# Patient Record
Sex: Female | Born: 2000 | Hispanic: No | Smoking: Never smoker
Health system: Southern US, Community
[De-identification: ages and names within clinical notes are randomized; demographics above are authoritative.]

## PROBLEM LIST (undated history)

## (undated) DIAGNOSIS — F909 Attention-deficit hyperactivity disorder, unspecified type: Secondary | ICD-10-CM

## (undated) HISTORY — PX: NO PAST SURGERIES: SHX2092

---
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
## 2019-01-14 ENCOUNTER — Other Ambulatory Visit: Payer: Self-pay

## 2019-01-14 ENCOUNTER — Encounter: Payer: Self-pay | Admitting: Physician Assistant

## 2019-01-14 ENCOUNTER — Ambulatory Visit (INDEPENDENT_AMBULATORY_CARE_PROVIDER_SITE_OTHER): Payer: BLUE CROSS/BLUE SHIELD | Admitting: Physician Assistant

## 2019-01-14 VITALS — Ht 66.0 in | Wt 279.0 lb

## 2019-01-14 DIAGNOSIS — N938 Other specified abnormal uterine and vaginal bleeding: Secondary | ICD-10-CM

## 2019-01-14 DIAGNOSIS — E559 Vitamin D deficiency, unspecified: Secondary | ICD-10-CM | POA: Diagnosis not present

## 2019-01-14 DIAGNOSIS — D508 Other iron deficiency anemias: Secondary | ICD-10-CM | POA: Diagnosis not present

## 2019-01-14 DIAGNOSIS — Z3044 Encounter for surveillance of vaginal ring hormonal contraceptive device: Secondary | ICD-10-CM

## 2019-01-14 DIAGNOSIS — R5383 Other fatigue: Secondary | ICD-10-CM | POA: Insufficient documentation

## 2019-01-14 DIAGNOSIS — E039 Hypothyroidism, unspecified: Secondary | ICD-10-CM

## 2019-01-14 MED ORDER — ETONOGESTREL-ETHINYL ESTRADIOL 0.12-0.015 MG/24HR VA RING: VAGINAL_RING | VAGINAL | 4 refills | Status: DC

## 2019-01-14 NOTE — Progress Notes (Signed)
Needs nuva ring in and doesn't have a new rx for it.

## 2019-01-14 NOTE — Progress Notes (Signed)
Subjective:    Patient ID: Carrie Spears, female    DOB: 04/13/2001, 18 y.o.   MRN: [id number]  HPI  Patient is an 18 year old female who presents to the clinic via WebEx for medication refills.  Patient would like a refill of her NuvaRing for birth control.  She does report to be sexually active.  She is tolerating the birth control well.  She has noticed some decrease in bleeding with her cycle and that time she has not had a cycle.  She did use a pregnancy test which was negative.  She admits she is not taking many of her medications such as thyroid, B12, vitamin D, iron.  She feels okay.  .. Active Ambulatory Problems    Diagnosis Date Noted  . 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 Pre-diabetes 12/28/2014  . Mood swings 12/28/2014  . Morbid obesity (HCC) 02/02/2016  . ADD (attention deficit disorder) 02/02/2016  . Other fatigue 02/02/2016  . Thyromegaly 02/02/2016  . Dysuria 02/02/2016  . Vitamin D deficiency 02/08/2016  . Elevated WBC count 02/08/2016  . Hypothyroidism 02/08/2016  . Reactive airway disease 12/15/2016  . Neck tightness 12/09/2017  . Neck pain 12/09/2017  . DUB (dysfunctional uterine bleeding) 07/08/2018  . Palpitations 07/08/2018  . Iron deficiency anemia 07/08/2018  . Low energy 01/14/2019   Resolved Ambulatory Problems    Diagnosis Date Noted  . No Resolved Ambulatory Problems   Past Medical History:  Diagnosis Date  . ADHD (attention deficit hyperactivity disorder)       Review of Systems See HPI.     Objective:   Physical Exam Vitals signs reviewed.  Constitutional:      Appearance: Normal appearance.  Cardiovascular:     Rate and Rhythm: Normal rate.     Pulses: Normal pulses.  Pulmonary:     Effort: Pulmonary effort is normal.  Neurological:     General: No focal deficit present.     Mental Status: She is alert.  Psychiatric:        Mood and Affect: Mood normal.            Assessment & Plan:  Marland KitchenMarland KitchenDiagnoses and all orders for this visit:  Encounter for surveillance of vaginal ring hormonal contraceptive device -     etonogestrel-ethinyl estradiol (NUVARING) 0.12-0.015 MG/24HR vaginal ring; Insert vaginally and leave in place for 3 consecutive weeks, then remove for 1 week.  DUB (dysfunctional uterine bleeding) -     etonogestrel-ethinyl estradiol (NUVARING) 0.12-0.015 MG/24HR vaginal ring; Insert vaginally and leave in place for 3 consecutive weeks, then remove for 1 week.  Vitamin D deficiency -     VITAMIN D 25 Hydroxy (Vit-D Deficiency, Fractures)  Iron deficiency anemia secondary to inadequate dietary iron intake -     CBC w/Diff/Platelet -     Fe+TIBC+Fer  Acquired hypothyroidism -     TSH  Low energy -     VITAMIN D 25 Hydroxy (Vit-D Deficiency, Fractures) -     B12 and Folate Panel -     CBC w/Diff/Platelet -     TSH -     Fe+TIBC+Fer   Will refill NuvaRing today.  She is 17 and does not need a Pap smear.  Did encourage her to get regular STD screening.  Did reassure her that sometimes periods will decrease with NuvaRing.  Certainly patient could have some thyroid, anemia, vitamin D abnormality since she has stopped all medication.  Going back on medication could help  to regulate this.  We will check with labs first.  Explained to patient I really need her blood pressure reading.  I would like for her to call back with this reading and we can make an addendum.

## 2019-03-11 IMAGING — CR DG CHEST 2V
2 series · 2 of 2 positions shown · non-contrast
Comparison: None.

CLINICAL DATA: Chest pain and palpitations

EXAM:
CHEST - 2 VIEW

[w chest pa]
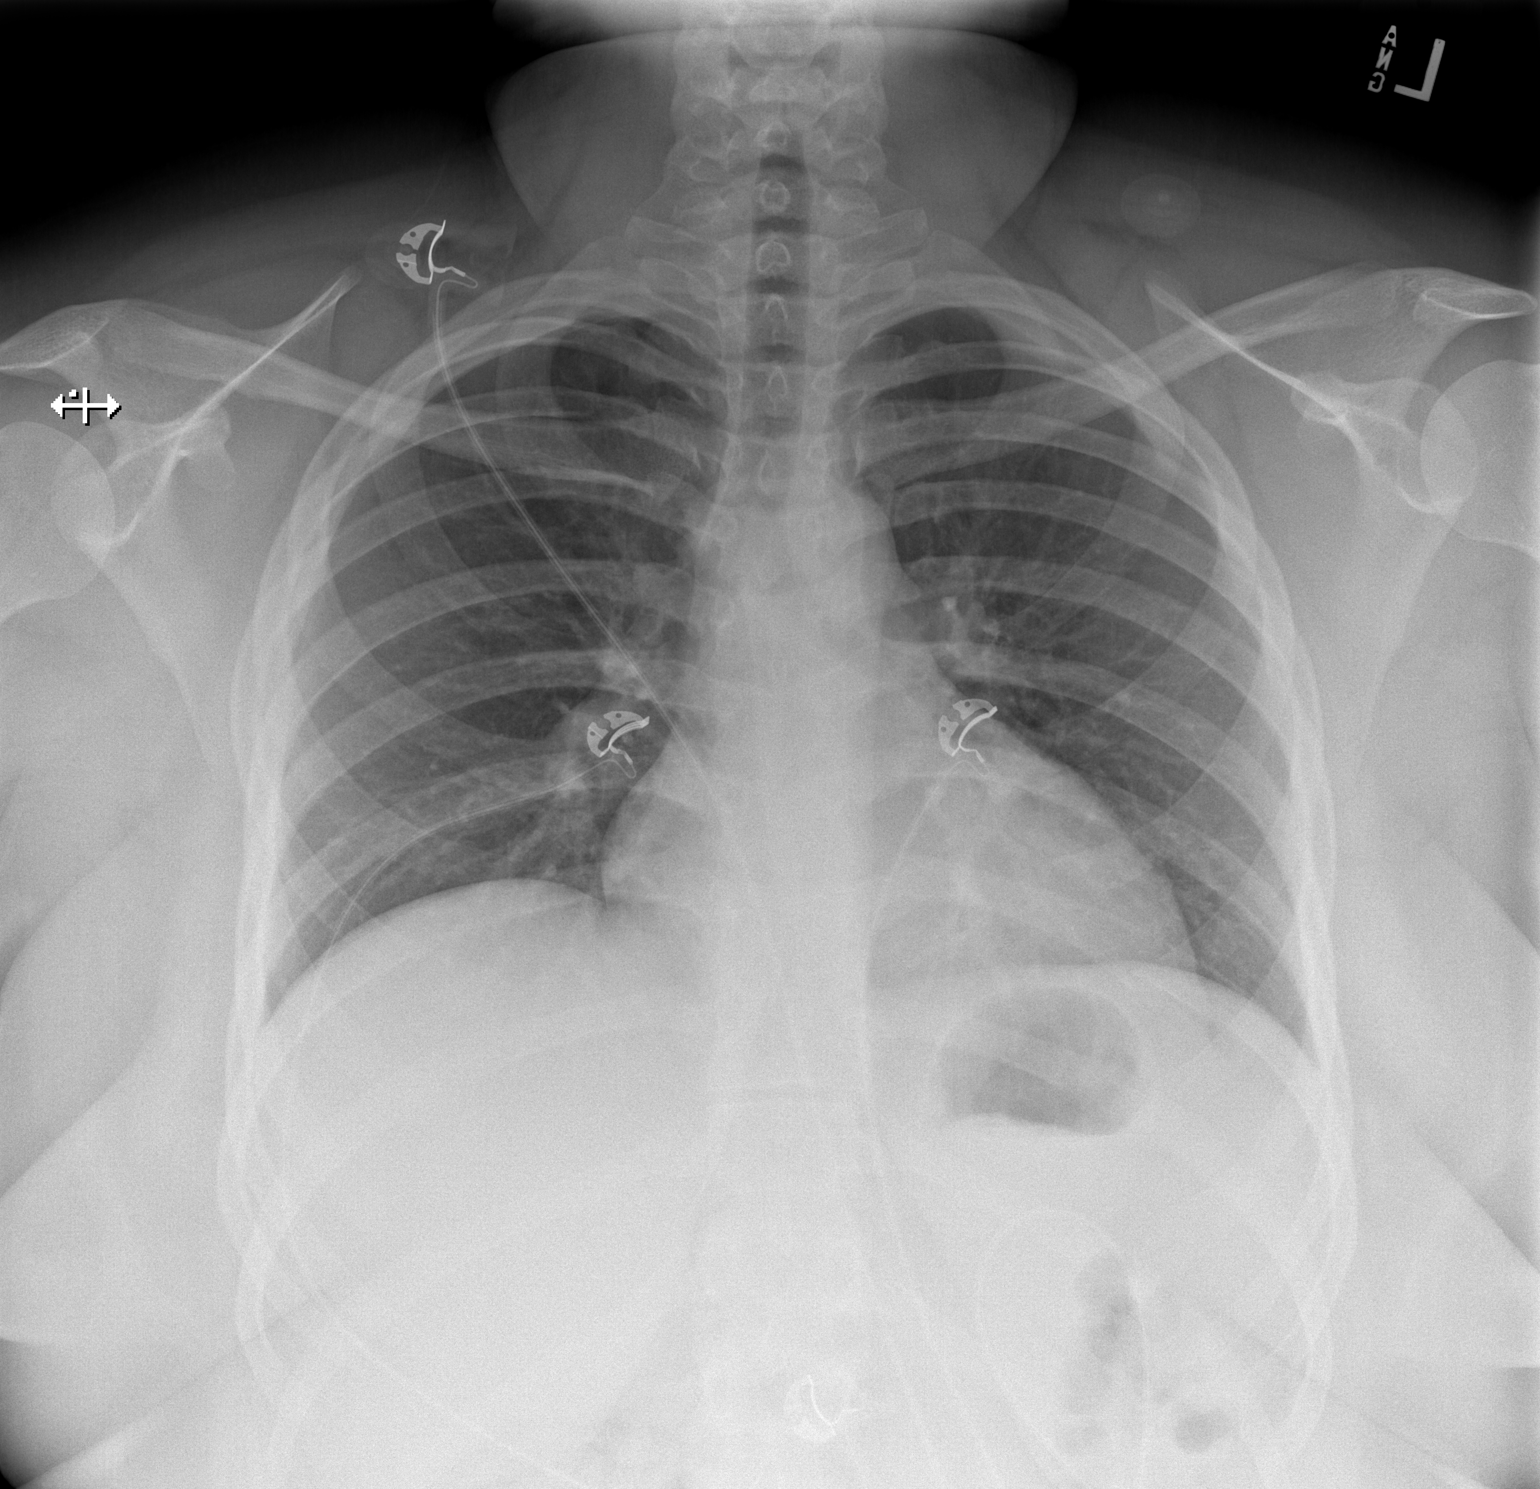

[w chest lat]
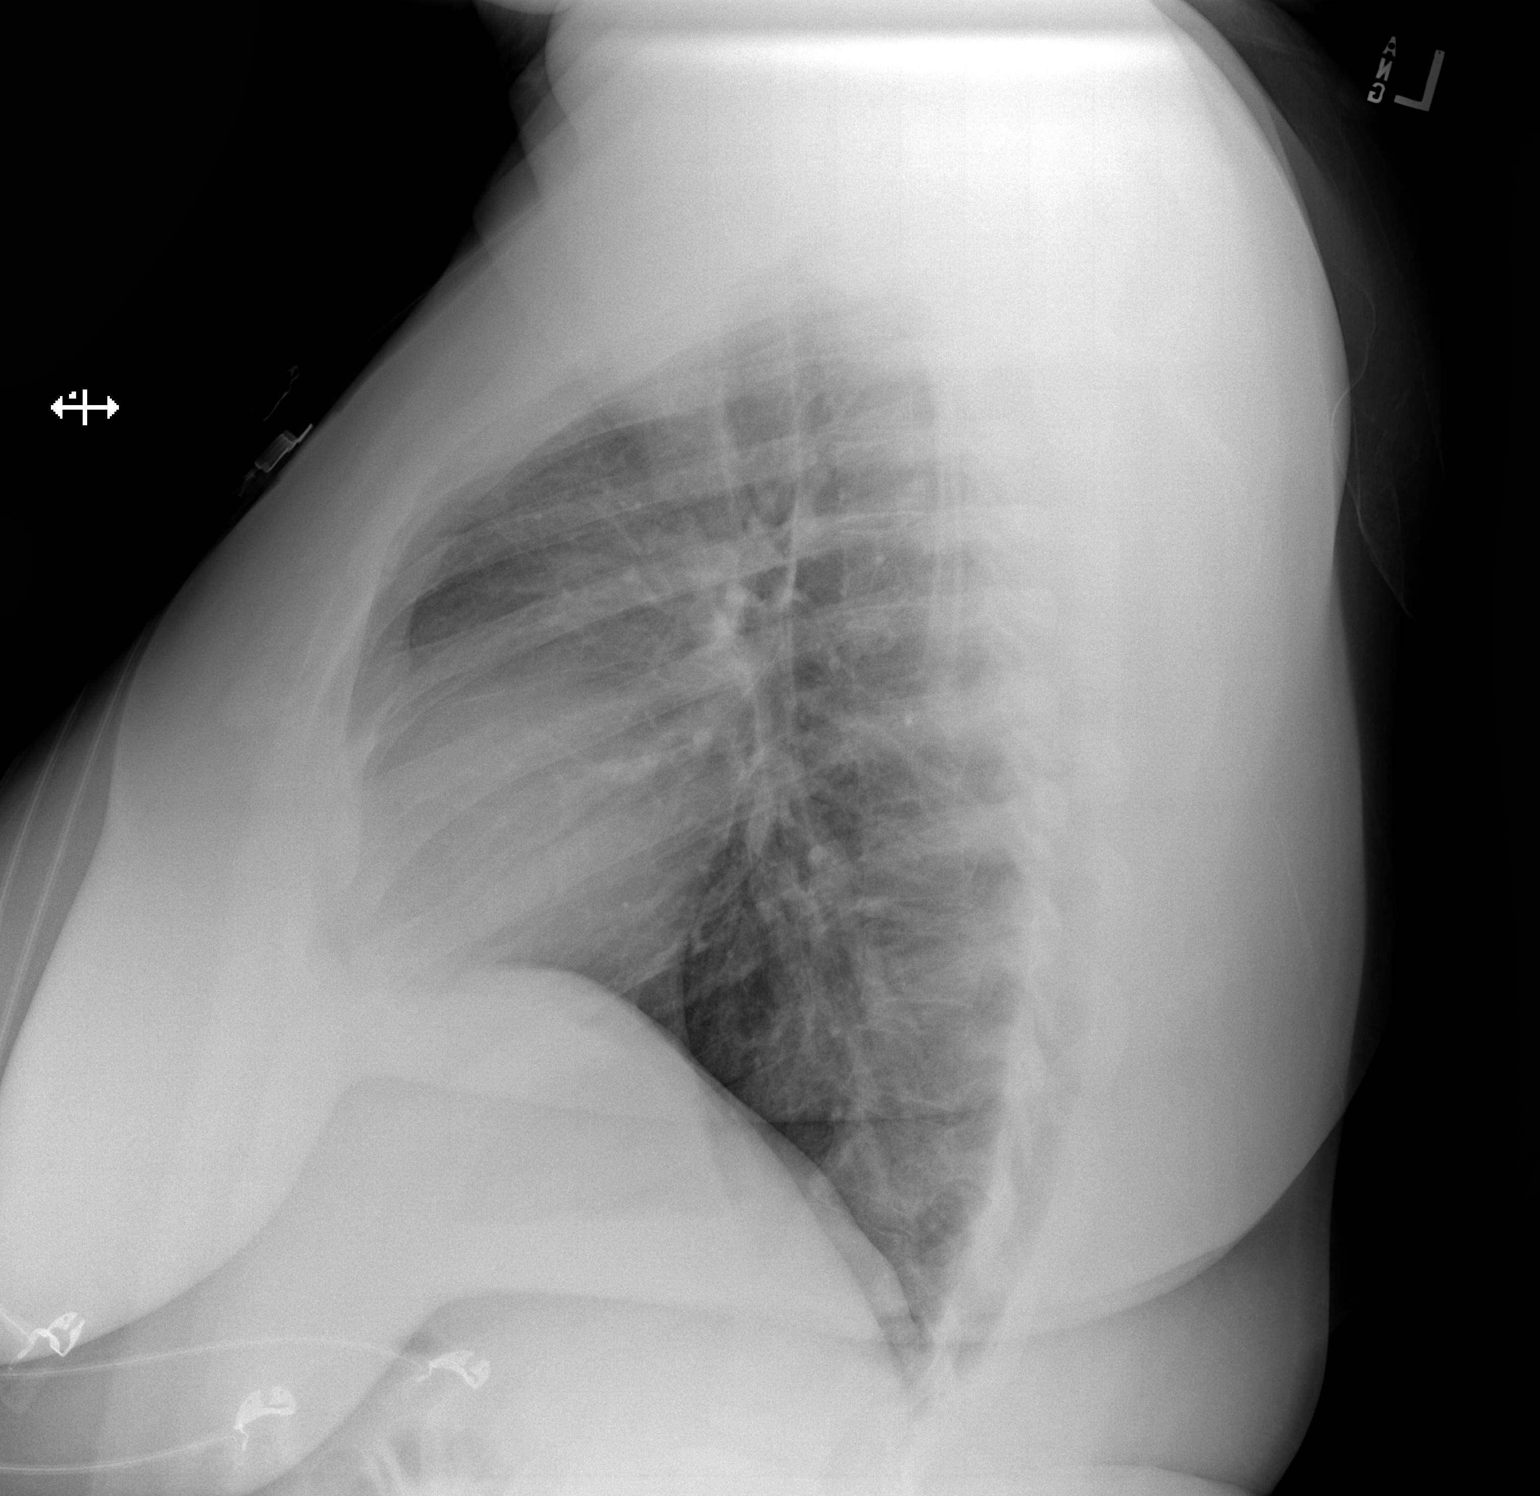

[2 of 2 positions shown; findings below may reference images not displayed]

FINDINGS: The heart size and mediastinal contours are within normal limits.
Both lungs are clear. The visualized skeletal structures are
unremarkable.
IMPRESSION: No active cardiopulmonary disease.

## 2019-03-21 ENCOUNTER — Ambulatory Visit: Payer: BC Managed Care – PPO | Admitting: Physician Assistant

## 2019-07-31 ENCOUNTER — Ambulatory Visit: Payer: BC Managed Care – PPO | Admitting: Family Medicine

## 2019-07-31 ENCOUNTER — Other Ambulatory Visit: Payer: Self-pay

## 2019-07-31 ENCOUNTER — Encounter: Payer: Self-pay | Admitting: Family Medicine

## 2019-07-31 ENCOUNTER — Ambulatory Visit (INDEPENDENT_AMBULATORY_CARE_PROVIDER_SITE_OTHER): Payer: BC Managed Care – PPO | Admitting: Family Medicine

## 2019-07-31 VITALS — BP 125/80 | HR 114 | Ht 66.0 in | Wt 286.0 lb

## 2019-07-31 DIAGNOSIS — R300 Dysuria: Secondary | ICD-10-CM | POA: Diagnosis not present

## 2019-07-31 DIAGNOSIS — N3000 Acute cystitis without hematuria: Secondary | ICD-10-CM | POA: Diagnosis not present

## 2019-07-31 DIAGNOSIS — N898 Other specified noninflammatory disorders of vagina: Secondary | ICD-10-CM | POA: Diagnosis not present

## 2019-07-31 DIAGNOSIS — R809 Proteinuria, unspecified: Secondary | ICD-10-CM

## 2019-07-31 DIAGNOSIS — R81 Glycosuria: Secondary | ICD-10-CM

## 2019-07-31 DIAGNOSIS — A5901 Trichomonal vulvovaginitis: Secondary | ICD-10-CM

## 2019-07-31 DIAGNOSIS — Z23 Encounter for immunization: Secondary | ICD-10-CM

## 2019-07-31 LAB — POCT URINALYSIS DIPSTICK
Glucose, UA: POSITIVE — AB
Ketones, UA: 15
Nitrite, UA: POSITIVE
Protein, UA: POSITIVE — AB
Spec Grav, UA: 1.025 (ref 1.010–1.025)
Urobilinogen, UA: 4 E.U./dL — AB
pH, UA: 5 (ref 5.0–8.0)

## 2019-07-31 MED ORDER — METRONIDAZOLE 500 MG PO TABS: 2000.0000 mg | ORAL_TABLET | Freq: Once | ORAL | 0 refills | Status: AC

## 2019-07-31 MED ORDER — SULFAMETHOXAZOLE-TRIMETHOPRIM 800-160 MG PO TABS: 1.0000 | ORAL_TABLET | Freq: Two times a day (BID) | ORAL | 0 refills | Status: DC

## 2019-07-31 NOTE — Addendum Note (Signed)
Addended by: Beatrice Lecher D on: 07/31/2019 07:45 PM   Modules accepted: Orders

## 2019-07-31 NOTE — Progress Notes (Addendum)
Acute Office Visit  Subjective:    Patient ID: Carrie Spears, female    DOB: 06/16/2001, 18 y.o.   MRN: [id number]  Chief Complaint  Patient presents with  . Dysuria    she reports that her sxs got worse on Tuesday. she has been taking AZO   . Vaginal Pain    HPI   Patient is in today for dysuria that started on Monday.  It was more noticeable by Tuesday.  She says she just has irritation externally when she urinates she has not noticed any rash or abnormal vaginal discharge.  She has not had any concerning pelvic pain.  She is not currently sexually active.  No fevers chills or sweats.  No back pain.  She has had some urinary frequency but feels like she is emptying her bladder normally.  Past Medical History:  Diagnosis Date  . ADHD (attention deficit hyperactivity disorder)     No past surgical history on file.  Family History  Problem Relation Age of Onset  . Hypertension Mother   . Lupus Maternal Aunt   . Diabetes Maternal Aunt     Social History   Socioeconomic History  . Marital status: Single    Spouse name: Not on file  . Number of children: Not on file  . Years of education: Not on file  . Highest education level: Not on file  Occupational History  . Not on file  Social Needs  . Financial resource strain: Not on file  . Food insecurity    Worry: Not on file    Inability: Not on file  . Transportation needs    Medical: Not on file    Non-medical: Not on file  Tobacco Use  . Smoking status: Never Smoker  . Smokeless tobacco: Never Used  Substance and Sexual Activity  . Alcohol use: No    Alcohol/week: 0.0 standard drinks  . Drug use: No  . Sexual activity: Never  Lifestyle  . Physical activity    Days per week: Not on file    Minutes per session: Not on file  . Stress: Not on file  Relationships  . Social Musicianconnections    Talks on phone: Not on file    Gets together: Not on file    Attends religious service: Not on file    Active member of  club or organization: Not on file    Attends meetings of clubs or organizations: Not on file    Relationship status: Not on file  . Intimate partner violence    Fear of current or ex partner: Not on file    Emotionally abused: Not on file    Physically abused: Not on file    Forced sexual activity: Not on file  Other Topics Concern  . Not on file  Social History Narrative  . Not on file    Outpatient Medications Prior to Visit  Medication Sig Dispense Refill  . albuterol (PROVENTIL HFA;VENTOLIN HFA) 108 (90 Base) MCG/ACT inhaler Inhale 2 puffs into the lungs every 6 (six) hours as needed for wheezing or shortness of breath. 1 Inhaler 5  . etonogestrel-ethinyl estradiol (NUVARING) 0.12-0.015 MG/24HR vaginal ring Insert vaginally and leave in place for 3 consecutive weeks, then remove for 1 week. 3 each 4  . levothyroxine (SYNTHROID, LEVOTHROID) 75 MCG tablet Take 1 tablet (75 mcg total) by mouth daily. (Patient not taking: Reported on 07/06/2018) 30 tablet 1  . Vitamin D, Ergocalciferol, (DRISDOL) 50000 units CAPS capsule Take  1 capsule (50,000 Units total) by mouth every 7 (seven) days. (Patient not taking: Reported on 01/14/2019) 12 capsule 0   No facility-administered medications prior to visit.     No Known Allergies  ROS     Objective:    Physical Exam  Constitutional: She is oriented to person, place, and time. She appears well-developed and well-nourished.  HENT:  Head: Normocephalic and atraumatic.  Eyes: Conjunctivae and EOM are normal.  Cardiovascular: Normal rate.  Pulmonary/Chest: Effort normal.  Musculoskeletal:     Comments: No CVA tenderness, no suprapubic tenderness.    Neurological: She is alert and oriented to person, place, and time.  Skin: Skin is dry. No pallor.  Psychiatric: She has a normal mood and affect. Her behavior is normal.  Vitals reviewed.   BP 125/80   Pulse (!) 114   Ht 5\' 6"  (1.676 m)   Wt 286 lb (129.7 kg)   LMP 07/28/2019 (Exact  Date)   SpO2 99%   BMI 46.16 kg/m  Wt Readings from Last 3 Encounters:  07/31/19 286 lb (129.7 kg) (>99 %, Z= 2.73)*  01/14/19 279 lb (126.6 kg) (>99 %, Z= 2.65)*  11/09/18 240 lb (108.9 kg) (>99 %, Z= 2.38)*   * Growth percentiles are based on CDC (Girls, 2-20 Years) data.    Health Maintenance Due  Topic Date Due  . HIV Screening  04/19/2015    There are no preventive care reminders to display for this patient.   Lab Results  Component Value Date   TSH 1.18 07/08/2018   Lab Results  Component Value Date   WBC 11.9 (H) 07/06/2018   HGB 10.7 (L) 07/06/2018   HCT 34.5 (L) 07/06/2018   MCV 86.0 07/06/2018   PLT 533 (H) 07/06/2018   Lab Results  Component Value Date   NA 139 07/06/2018   K 4.1 07/06/2018   CO2 24 07/06/2018   GLUCOSE 85 07/06/2018   BUN 7 07/06/2018   CREATININE 0.60 07/06/2018   BILITOT 0.3 02/07/2016   ALKPHOS 84 02/07/2016   AST 14 02/07/2016   ALT 19 02/07/2016   PROT 7.1 02/07/2016   ALBUMIN 4.2 02/07/2016   CALCIUM 9.3 07/06/2018   ANIONGAP 9 07/06/2018   No results found for: CHOL No results found for: HDL No results found for: LDLCALC No results found for: TRIG No results found for: CHOLHDL No results found for: HGBA1C     Assessment & Plan:   Problem List Items Addressed This Visit      Other   Dysuria   Relevant Orders   POCT urinalysis dipstick (Completed)   WET PREP FOR [place] (Completed)    Other Visit Diagnoses    Acute cystitis without hematuria    -  Primary   Vaginal itching       Relevant Orders   POCT urinalysis dipstick (Completed)   WET PREP FOR [place], [place] (Completed)   Need for immunization against influenza       Relevant Orders   Flu Vaccine QUAD 36+ mos IM (Completed)   Proteinuria, unspecified type       Relevant Orders   Urinalysis, Routine w reflex microscopic   Glucosuria       Relevant Orders   Urinalysis, Routine w reflex microscopic   Trichomonas vaginitis        Relevant Medications   sulfamethoxazole-trimethoprim (BACTRIM DS) 800-160 MG tablet   metroNIDAZOLE (FLAGYL) 500 MG tablet     Acute UTI-urinalysis and symptoms  consistent with UTI.  We will go ahead and treat with Bactrim.  Call if not better in 1 week.  Proteinuria-could just be secondary to UTI but would like her to repeat UA in about 1-1/2 weeks to make sure that it resolves.  Glucosuria-again likely because of UTI and taking Azo which can cause some false positives on the dipstick.  Again we will repeat urinalysis in 1 week if still positive then recommend doing a hemoglobin A1c for further work-up.  Offered to do A1c today but patient declined.  Vaginal itching-wet prep sent today for vaginal itching.  Wet prep came back positive for trichomoniasis.  Will treat with 2 g of metronidazole orally  Meds ordered this encounter  Medications  . sulfamethoxazole-trimethoprim (BACTRIM DS) 800-160 MG tablet    Sig: Take 1 tablet by mouth 2 (two) times daily.    Dispense:  6 tablet    Refill:  0  . metroNIDAZOLE (FLAGYL) 500 MG tablet    Sig: Take 4 tablets (2,000 mg total) by mouth once for 1 dose.    Dispense:  4 tablet    Refill:  0     Nani Gasser, MD

## 2019-08-01 LAB — WET PREP FOR TRICH, YEAST, CLUE
MICRO NUMBER:: 993742
Specimen Quality: ADEQUATE

## 2019-08-06 ENCOUNTER — Ambulatory Visit (INDEPENDENT_AMBULATORY_CARE_PROVIDER_SITE_OTHER): Payer: BC Managed Care – PPO | Admitting: Physician Assistant

## 2019-08-06 ENCOUNTER — Other Ambulatory Visit: Payer: Self-pay

## 2019-08-06 VITALS — BP 121/59 | HR 97 | Temp 98.5°F | Ht 66.0 in | Wt 281.0 lb

## 2019-08-06 DIAGNOSIS — R7303 Prediabetes: Secondary | ICD-10-CM | POA: Diagnosis not present

## 2019-08-06 DIAGNOSIS — R81 Glycosuria: Secondary | ICD-10-CM | POA: Diagnosis not present

## 2019-08-06 DIAGNOSIS — N72 Inflammatory disease of cervix uteri: Secondary | ICD-10-CM | POA: Diagnosis not present

## 2019-08-06 DIAGNOSIS — R300 Dysuria: Secondary | ICD-10-CM

## 2019-08-06 DIAGNOSIS — N3001 Acute cystitis with hematuria: Secondary | ICD-10-CM

## 2019-08-06 DIAGNOSIS — R102 Pelvic and perineal pain: Secondary | ICD-10-CM

## 2019-08-06 LAB — POCT URINALYSIS DIPSTICK
Bilirubin, UA: NEGATIVE
Glucose, UA: POSITIVE — AB
Nitrite, UA: POSITIVE
Protein, UA: POSITIVE — AB
Spec Grav, UA: 1.02 (ref 1.010–1.025)
Urobilinogen, UA: 1 E.U./dL
pH, UA: 6 (ref 5.0–8.0)

## 2019-08-06 LAB — POCT GLYCOSYLATED HEMOGLOBIN (HGB A1C): Hemoglobin A1C: 5.7 % — AB (ref 4.0–5.6)

## 2019-08-06 MED ORDER — FLUCONAZOLE 150 MG PO TABS: 150.0000 mg | ORAL_TABLET | Freq: Once | ORAL | 0 refills | Status: AC

## 2019-08-06 MED ORDER — CEFTRIAXONE SODIUM 1 G IJ SOLR
1.0000 g | Freq: Once | INTRAMUSCULAR | Status: AC
  Administered 2019-08-06: 1 g via INTRAMUSCULAR

## 2019-08-06 MED ORDER — NITROFURANTOIN MONOHYD MACRO 100 MG PO CAPS: 100.0000 mg | ORAL_CAPSULE | Freq: Two times a day (BID) | ORAL | 0 refills | Status: DC

## 2019-08-06 MED ORDER — AZITHROMYCIN 1 G PO PACK
1.0000 g | PACK | Freq: Once | ORAL | Status: AC
  Administered 2019-08-06: 1 g via ORAL

## 2019-08-06 NOTE — Patient Instructions (Signed)
macrobid to start today.  Cervicitis  Cervicitis is when the cervix gets irritated and swollen. Your cervix is the lower end of your uterus. Follow these instructions at home:  Do not have sex until your doctor says it is okay.  Take over-the-counter and prescription medicines only as told by your doctor.  If you were prescribed an antibiotic medicine, take it as told by your doctor. Do not stop taking it even if you start to feel better.  Keep all follow-up visits as told by your doctor. This is important. Contact a doctor if:  Your symptoms come back after treatment.  Your symptoms get worse after treatment.  You have a fever.  You feel tired (fatigued).  Your belly (abdomen) hurts.  You feel like you are going to throw up (are nauseous).  You throw up (vomit).  You have watery poop (diarrhea).  Your back hurts. Get help right away if:  You have very bad pain in your belly, and medicine does not help it.  You cannot pee (urinate). Summary  Cervicitis is when the cervix gets irritated and swollen.  Do not have sex until your doctor says it is okay.  If you need to take an antibiotic, do not stop taking even if you start to feel better. Take medicines only as told by your doctor. This information is not intended to replace advice given to you by your health care provider. Make sure you discuss any questions you have with your health care provider. Document Released: 07/11/2008 Document Revised: 09/14/2017 Document Reviewed: 06/18/2016 Elsevier Patient Education  2020 [employer].

## 2019-08-06 NOTE — Progress Notes (Signed)
Culture please.

## 2019-08-06 NOTE — Progress Notes (Signed)
Subjective:    Patient ID: Carrie Spears, female    DOB: 10-15-2001, 18 y.o.   MRN: [id number]  HPI  Pt is a 18 yo female who presents to the clinic with mother with urinary symptoms that have not resolved. She was seen on 10/15 with Dr. Linford Arnold and treated for UTI with bactrim and BV with metronidazole. Glucose and protein were found in her urine that needed to be worked up.   Pt continues to have pain with urination. She is sexually active but not having sex right now. She feels vaginal irritated but denies any discharge right now.   .. Active Ambulatory Problems    Diagnosis Date Noted  . 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 Pre-diabetes 12/28/2014  . Mood swings 12/28/2014  . Morbid obesity (HCC) 02/02/2016  . ADD (attention deficit disorder) 02/02/2016  . Other fatigue 02/02/2016  . Thyromegaly 02/02/2016  . Dysuria 02/02/2016  . Vitamin D deficiency 02/08/2016  . Elevated WBC count 02/08/2016  . Hypothyroidism 02/08/2016  . Reactive airway disease 12/15/2016  . Neck pain 12/09/2017  . DUB (dysfunctional uterine bleeding) 07/08/2018  . Palpitations 07/08/2018  . Iron deficiency anemia 07/08/2018  . Low energy 01/14/2019  . Cervicitis and endocervicitis 08/08/2019   Resolved Ambulatory Problems    Diagnosis Date Noted  . Neck tightness 12/09/2017   Past Medical History:  Diagnosis Date  . ADHD (attention deficit hyperactivity disorder)      Review of Systems See HPI.     Objective:   Physical Exam Vitals signs reviewed.  Constitutional:      Appearance: Normal appearance. She is obese.  Cardiovascular:     Rate and Rhythm: Normal rate and regular rhythm.     Pulses: Normal pulses.  Pulmonary:     Effort: Pulmonary effort is normal.     Breath sounds: Normal breath sounds.  Abdominal:     General: Bowel sounds are normal.     Palpations: Abdomen is soft.     Tenderness: There is abdominal tenderness. There is no  right CVA tenderness, left CVA tenderness, guarding or rebound.     Comments: Suprapubic tenderness to palpation.   Genitourinary:    Vagina: Vaginal discharge present.     Comments: Very painful exam. Bilateral adnexal tenderness.  Yellow vaginal discharge.  Erythematous vaginal walls. Friable and inflammed cervix with active yellow discharge coming out of os.  Neurological:     General: No focal deficit present.     Mental Status: She is alert and oriented to person, place, and time.  Psychiatric:        Mood and Affect: Mood normal.        Behavior: Behavior normal.           Assessment & Plan:  Marland KitchenMarland KitchenBriyanna was seen today for dysuria.  Diagnoses and all orders for this visit:  Cervicitis and endocervicitis -     cefTRIAXone (ROCEPHIN) injection 1 g -     azithromycin (ZITHROMAX) powder 1 g  Dysuria -     POCT urinalysis dipstick -     SureSwab, Vaginosis/Vaginitis Plus -     Urine Culture  Glucosuria -     POCT glycosylated hemoglobin (Hb A1C)  Pre-diabetes  Acute cystitis with hematuria -     nitrofurantoin, macrocrystal-monohydrate, (MACROBID) 100 MG capsule; Take 1 capsule (100 mg total) by mouth 2 (two) times daily.  Other orders -     fluconazole (DIFLUCAN) 150 MG tablet; Take 1  tablet (150 mg total) by mouth once for 1 dose. Repeat in 48-72 hours if symptoms persist.   .. Results for orders placed or performed in visit on 08/06/19  Urine Culture   Specimen: Urine  Result Value Ref Range   MICRO NUMBER: [id number]    SPECIMEN QUALITY: Adequate    Sample Source NOT GIVEN    STATUS: FINAL    ISOLATE 1:      Growth of mixed flora was isolated, suggesting probable contamination. No further testing will be performed. If clinically indicated, recollection using a method to minimize contamination, with prompt transfer to Urine Culture Transport Tube, is  recommended.   POCT urinalysis dipstick  Result Value Ref Range   Color, UA yellow    Clarity, UA  clear    Glucose, UA Positive (A) Negative   Bilirubin, UA negative    Ketones, UA trace    Spec Grav, UA 1.020 1.010 - 1.025   Blood, UA small    pH, UA 6.0 5.0 - 8.0   Protein, UA Positive (A) Negative   Urobilinogen, UA 1.0 0.2 or 1.0 E.U./dL   Nitrite, UA positive    Leukocytes, UA Large (3+) (A) Negative   Appearance     Odor    POCT glycosylated hemoglobin (Hb A1C)  Result Value Ref Range   Hemoglobin A1C 5.7 (A) 4.0 - 5.6 %   HbA1c POC (<> result, manual entry)     HbA1c, POC (prediabetic range)     HbA1c, POC (controlled diabetic range)     UA continues to show signs of UTI. Will culture and switch up abx since could be some abx resistance. Sent macrobid.   Based on today bimanuel and internal exam I am highly suspicious of STD. Sent surewab off. Treated empirically with azithromycin slurry here in office and rocephin. At this time no intercourse until symptoms resolve and we find out exactly what is going on.   Due to glucose in urine and the abx she will likely need some diflucan. A!C is pre-diabetes. Discussed a little. Certainly need to start watch sugars/carbs and work on weight loss. Follow up for better discussion.   Discussed use of condoms to prevent STD.  Call with any worsening pain or problems. Will call with results.

## 2019-08-08 ENCOUNTER — Encounter: Payer: Self-pay | Admitting: Physician Assistant

## 2019-08-08 DIAGNOSIS — R102 Pelvic and perineal pain: Secondary | ICD-10-CM | POA: Insufficient documentation

## 2019-08-08 DIAGNOSIS — N72 Inflammatory disease of cervix uteri: Secondary | ICD-10-CM | POA: Insufficient documentation

## 2019-08-08 LAB — URINE CULTURE
MICRO NUMBER:: 1014875
SPECIMEN QUALITY:: ADEQUATE

## 2019-08-08 NOTE — Progress Notes (Signed)
Urine culture did not show any one bacteria so more suggestive of contamination vs infection from urinary tract.

## 2019-08-12 ENCOUNTER — Encounter: Payer: Self-pay | Admitting: Physician Assistant

## 2019-08-12 LAB — SURESWAB(R) PLUS
Atopobium vaginae: NOT DETECTED Log cells/mL
C. albicans, DNA: NOT DETECTED
C. glabrata, DNA: NOT DETECTED
C. parapsilosis, DNA: NOT DETECTED
C. trachomatis RNA, TMA: NOT DETECTED
C. tropicalis, DNA: NOT DETECTED
Gardnerella vaginalis: <4.7 Log (cells/mL)
LACTOBACILLUS SPECIES: NOT DETECTED Log cells/mL
MEGASPHAERA SPECIES: NOT DETECTED Log cells/mL
N. gonorrhoeae RNA, TMA: NOT DETECTED
Trichomonas vaginalis RNA: NOT DETECTED

## 2019-08-12 MED ORDER — FLUCONAZOLE 150 MG PO TABS: 150.0000 mg | ORAL_TABLET | Freq: Once | ORAL | 0 refills | Status: AC

## 2019-08-13 ENCOUNTER — Encounter: Payer: Self-pay | Admitting: Physician Assistant

## 2019-08-18 NOTE — Progress Notes (Signed)
Fortunately, all your symptoms were due to urinary tract infection. You were negative for STD, BV, yeast, trichomonas. GREAT news.

## 2019-09-15 ENCOUNTER — Encounter: Payer: Self-pay | Admitting: Physician Assistant

## 2019-09-15 ENCOUNTER — Ambulatory Visit (INDEPENDENT_AMBULATORY_CARE_PROVIDER_SITE_OTHER): Payer: BC Managed Care – PPO | Admitting: Physician Assistant

## 2019-09-15 VITALS — BP 144/81 | Temp 100.4°F | Ht 66.0 in | Wt 281.0 lb

## 2019-09-15 DIAGNOSIS — J039 Acute tonsillitis, unspecified: Secondary | ICD-10-CM | POA: Diagnosis not present

## 2019-09-15 MED ORDER — PREDNISONE 50 MG PO TABS: ORAL_TABLET | ORAL | 0 refills | Status: DC

## 2019-09-15 MED ORDER — LIDOCAINE VISCOUS HCL 2 % MT SOLN: OROMUCOSAL | 0 refills | Status: DC

## 2019-09-15 MED ORDER — AMOXICILLIN-POT CLAVULANATE 875-125 MG PO TABS: 1.0000 | ORAL_TABLET | Freq: Two times a day (BID) | ORAL | 0 refills | Status: DC

## 2019-09-15 NOTE — Progress Notes (Signed)
Patient ID: Carrie Spears, female   DOB: [birth year], 18 y.o.   MRN: [id number] .Marland KitchenVirtual Visit via Video Note  I connected with Carrie Spears on 09/15/19 at 11:10 AM EST by a video enabled telemedicine application and verified that I am speaking with the correct person using two identifiers.  Location: Patient: home Provider: clinic   I discussed the limitations of evaluation and management by telemedicine and the availability of in person appointments. The patient expressed understanding and agreed to proceed.  History of Present Illness: Patient is a 18 year old female with history of tonsillitis who calls into the clinic with similar symptoms for the last 2 days.  Symptoms started with a scratchy and sore throat.  Symptoms quickly worsened to trouble swallowing and enlarged tonsils.  She has noticed some white exudate on the back of her throat.  It is painful to eat or drink.  She does have a fever with last checked at 100.8.  She reports body aches.  No one else in the house is sick.  She denies any loss of smell or taste or GI side effects. No cough or SOB.She has had no known direct Covid exposure. She had to go to ED last time for this in January 2020.   .. Active Ambulatory Problems    Diagnosis Date Noted  . 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 Pre-diabetes 12/28/2014  . Mood swings 12/28/2014  . Morbid obesity (HCC) 02/02/2016  . ADD (attention deficit disorder) 02/02/2016  . Other fatigue 02/02/2016  . Thyromegaly 02/02/2016  . Dysuria 02/02/2016  . Vitamin D deficiency 02/08/2016  . Elevated WBC count 02/08/2016  . Hypothyroidism 02/08/2016  . Reactive airway disease 12/15/2016  . Neck pain 12/09/2017  . DUB (dysfunctional uterine bleeding) 07/08/2018  . Palpitations 07/08/2018  . Iron deficiency anemia 07/08/2018  . Low energy 01/14/2019  . Cervicitis and endocervicitis 08/08/2019  . Adnexal tenderness 08/08/2019   Resolved  Ambulatory Problems    Diagnosis Date Noted  . Neck tightness 12/09/2017   Past Medical History:  Diagnosis Date  . ADHD (attention deficit hyperactivity disorder)    Reviewed med, allergy, problem list.     Observations/Objective: No acute distress Swollen bilateral tonsils. Midline uvula. Exudate present.  No cough.   .. Today's Vitals   09/15/19 1022  BP: (!) 144/81  Temp: (!) 100.4 F (38 C)  TempSrc: Oral  Weight: 281 lb (127.5 kg)  Height: 5\' 6"  (1.676 m)   Body mass index is 45.35 kg/m.    Assessment and Plan: Marland KitchenZyria was seen today for sore throat.  Diagnoses and all orders for this visit:  Tonsillitis with exudate -     predniSONE (DELTASONE) 50 MG tablet; One tab PO daily for 5 days. -     amoxicillin-clavulanate (AUGMENTIN) 875-125 MG tablet; Take 1 tablet by mouth 2 (two) times daily. -     lidocaine (XYLOCAINE) 2 % solution; Gargle and spit every 4-6 hours as needed for sore throat.   Patient has classic symptoms of tonsillitis.  She has no known direct Covid contact and/or no sick contacts in the home.  Visibly saw exudate on tonsils.  We will treat with Augmentin, prednisone, lidocaine mouthwash.  Continue to use ibuprofen up to 800 mg up to 3 times a day for pain and fever.  Rest and hydrate.  Patient was written out of work for the next 24 hours.  If she is not significantly better in the 24-hour timeframe or she has  any other Covid symptoms please get Covid tested and self isolate until results are back.  Please change her toothbrush.    Follow Up Instructions:    I discussed the assessment and treatment plan with the patient. The patient was provided an opportunity to ask questions and all were answered. The patient agreed with the plan and demonstrated an understanding of the instructions.   The patient was advised to call back or seek an in-person evaluation if the symptoms worsen or if the condition fails to improve as anticipated.   Iran Planas, PA-C

## 2019-09-15 NOTE — Progress Notes (Deleted)
Started two days ago: Scratchy throat Trouble swallowing  Tonsils swollen/white  Uncomfortable to eat/drink Body aches  No change in taste/smell, no congestion, no abdominal pain/nausea/diarrhea

## 2020-01-26 ENCOUNTER — Telehealth (INDEPENDENT_AMBULATORY_CARE_PROVIDER_SITE_OTHER): Payer: BC Managed Care – PPO | Admitting: Physician Assistant

## 2020-01-26 ENCOUNTER — Telehealth: Payer: Self-pay | Admitting: Physician Assistant

## 2020-01-26 VITALS — Temp 98.7°F

## 2020-01-26 DIAGNOSIS — R112 Nausea with vomiting, unspecified: Secondary | ICD-10-CM | POA: Diagnosis not present

## 2020-01-26 DIAGNOSIS — R7303 Prediabetes: Secondary | ICD-10-CM | POA: Diagnosis not present

## 2020-01-26 DIAGNOSIS — E1121 Type 2 diabetes mellitus with diabetic nephropathy: Secondary | ICD-10-CM | POA: Insufficient documentation

## 2020-01-26 DIAGNOSIS — R197 Diarrhea, unspecified: Secondary | ICD-10-CM | POA: Diagnosis not present

## 2020-01-26 DIAGNOSIS — R1013 Epigastric pain: Secondary | ICD-10-CM | POA: Diagnosis not present

## 2020-01-26 MED ORDER — ONDANSETRON 8 MG PO TBDP: 8.0000 mg | ORAL_TABLET | Freq: Three times a day (TID) | ORAL | 1 refills | Status: DC | PRN

## 2020-01-26 MED ORDER — DIPHENOXYLATE-ATROPINE 2.5-0.025 MG PO TABS: ORAL_TABLET | ORAL | 0 refills | Status: DC

## 2020-01-26 NOTE — Telephone Encounter (Signed)
PT has a substantial bill with [facility]. Because of this she isn't able to get the blood work done. PT asked for labs to be sent to [facility].    Please advise.

## 2020-01-26 NOTE — Patient Instructions (Signed)
Gastritis, Adult  Gastritis is swelling (inflammation) of the stomach. Gastritis can develop quickly (acute). It can also develop slowly over time (chronic). It is important to get help for this condition. If you do not get help, your stomach can bleed, and you can get sores (ulcers) in your stomach. What are the causes? This condition may be caused by:  Germs that get to your stomach.  Drinking too much alcohol.  Medicines you are taking.  Too much acid in the stomach.  A disease of the intestines or stomach.  Stress.  An allergic reaction.  Crohn's disease.  Some cancer treatments (radiation). Sometimes the cause of this condition is not known. What are the signs or symptoms? Symptoms of this condition include:  Pain in your stomach.  A burning feeling in your stomach.  Feeling sick to your stomach (nauseous).  Throwing up (vomiting).  Feeling too full after you eat.  Weight loss.  Bad breath.  Throwing up blood.  Blood in your poop (stool). How is this diagnosed? This condition may be diagnosed with:  Your medical history and symptoms.  A physical exam.  Tests. These can include: ? Blood tests. ? Stool tests. ? A procedure to look inside your stomach (upper endoscopy). ? A test in which a sample of tissue is taken for testing (biopsy). How is this treated? Treatment for this condition depends on what caused it. You may be given:  Antibiotic medicine, if your condition was caused by germs.  H2 blockers and similar medicines, if your condition was caused by too much acid. Follow these instructions at home: Medicines  Take over-the-counter and prescription medicines only as told by your doctor.  If you were prescribed an antibiotic medicine, take it as told by your doctor. Do not stop taking it even if you start to feel better. Eating and drinking   Eat small meals often, instead of large meals.  Avoid foods and drinks that make your symptoms  worse.  Drink enough fluid to keep your pee (urine) pale yellow. Alcohol use  Do not drink alcohol if: ? Your doctor tells you not to drink. ? You are pregnant, may be pregnant, or are planning to become pregnant.  If you drink alcohol: ? Limit your use to:  0-1 drink a day for women.  0-2 drinks a day for men. ? Be aware of how much alcohol is in your drink. In the [place] one drink equals one 12 oz bottle of beer (355 mL), one 5 oz glass of wine (148 mL), or one 1 oz glass of hard liquor (44 mL). General instructions  Talk with your doctor about ways to manage stress. You can exercise or do deep breathing, meditation, or yoga.  Do not smoke or use products that have nicotine or tobacco. If you need help quitting, ask your doctor.  Keep all follow-up visits as told by your doctor. This is important. Contact a doctor if:  Your symptoms get worse.  Your symptoms go away and then come back. Get help right away if:  You throw up blood or something that looks like coffee grounds.  You have black or dark red poop.  You throw up any time you try to drink fluids.  Your stomach pain gets worse.  You have a fever.  You do not feel better after one week. Summary  Gastritis is swelling (inflammation) of the stomach.  You must get help for this condition. If you do not get help, your stomach   can bleed, and you can get sores (ulcers).  This condition is diagnosed with medical history, physical exam, or tests.  You can be treated with medicines for germs or medicines to block too much acid in your stomach. This information is not intended to replace advice given to you by your health care provider. Make sure you discuss any questions you have with your health care provider. Document Revised: 02/19/2018 Document Reviewed: 02/19/2018 Elsevier Patient Education  2020 [employer]  

## 2020-01-26 NOTE — Telephone Encounter (Signed)
Labs reordered for [facility] and faxed. Did patient need a copy of requisition?

## 2020-01-26 NOTE — Progress Notes (Signed)
Patient ID: Carrie Spears, female   DOB: 2001/05/08, 19 y.o.   MRN: [id number] .Marland KitchenVirtual Visit via Video Note  I connected with [name] on 01/26/2020 at 10:50 AM EDT by a video enabled telemedicine application and verified that I am speaking with the correct person using two identifiers.  Location: Patient: home Provider: clinic   I discussed the limitations of evaluation and management by telemedicine and the availability of in person appointments. The patient expressed understanding and agreed to proceed.  History of Present Illness: Patient is a 19 year old obese female with prediabetes who comes into the clinic today with diarrhea, nausea, vomiting since Saturday, 2 days ago.  She woke up Saturday morning with her stomach aching and feeling very nauseated.  She is also having some epigastric pain.  She has vomited a few times each day.  Her last time vomiting was 7:00 this morning.  She is having frequent loose stools.  She denies any fever, chills, shortness of breath, sweats, cough, loss of smell or taste.  She does have decreased appetite.  She has been taking naproxen and Pepto-Bismol for symptoms.  Her stools are getting a little darker after using the Pepto-Bismol but denies any hematochezia or melena.  Her mother did have similar symptoms earlier last week.  Her mother was tested for Covid and negative.  Acute symptoms have been going on for 2 days but she has been having some daily epigastric cramping and pain first thing in the morning.  Usually last about 20 minutes and then gets better.  Bowel movements seem to help.  Patient took a home pregnancy test that was negative.  She does not drink alcohol.    Active Ambulatory Problems    Diagnosis Date Noted  . 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 Prediabetes 12/28/2014  . Mood swings 12/28/2014  . Morbid obesity ([place]) 02/02/2016  . ADD (attention deficit disorder) 02/02/2016  . Other fatigue  02/02/2016  . Thyromegaly 02/02/2016  . Dysuria 02/02/2016  . Vitamin D deficiency 02/08/2016  . Elevated WBC count 02/08/2016  . Hypothyroidism 02/08/2016  . Reactive airway disease 12/15/2016  . Neck pain 12/09/2017  . DUB (dysfunctional uterine bleeding) 07/08/2018  . Palpitations 07/08/2018  . Iron deficiency anemia 07/08/2018  . Low energy 01/14/2019  . Cervicitis and endocervicitis 08/08/2019  . Adnexal tenderness 08/08/2019  . Epigastric abdominal pain 01/26/2020  . Nausea and vomiting 01/26/2020  . Diarrhea 01/26/2020  . 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 Neck tightness 12/09/2017   Past Medical History:  Diagnosis Date  . ADHD (attention deficit hyperactivity disorder)    REviewed med, allergy, problem list.     Observations/Objective: No acute distress. No cough or labored breathing.  Normal mood and appearance.   .. Today's Vitals   01/26/20 0624  Temp: 98.7 F (37.1 C)  TempSrc: Oral   There is no height or weight on file to calculate BMI.     Assessment and Plan: Marland KitchenMarland KitchenCarin was seen today for emesis, diarrhea and hypertension.  Diagnoses and all orders for this visit:  Epigastric abdominal pain -     Cancel: COMPLETE METABOLIC PANEL WITH GFR -     Cancel: Hemoglobin A1c -     Cancel: CBC with Differential/Platelet -     Cancel: Lipase -     CBC with Differential/Platelet -     Lipase -  Hemoglobin A1c -     CMP14+EGFR  Nausea and vomiting, intractability of vomiting not specified, unspecified vomiting type -     ondansetron (ZOFRAN-ODT) 8 MG disintegrating tablet; Take 1 tablet (8 mg total) by mouth every 8 (eight) hours as needed for nausea. -     Cancel: COMPLETE METABOLIC PANEL WITH GFR -     Cancel: Hemoglobin A1c -     Cancel: CBC with Differential/Platelet -     Cancel: Lipase -     CBC with Differential/Platelet -     Lipase -      Hemoglobin A1c -     CMP14+EGFR  Diarrhea, unspecified type -     diphenoxylate-atropine (LOMOTIL) 2.5-0.025 MG tablet; One to 2 tablets by mouth 4 times a day as needed for diarrhea. -     Cancel: COMPLETE METABOLIC PANEL WITH GFR -     Cancel: Hemoglobin A1c -     Cancel: CBC with Differential/Platelet -     Cancel: Lipase -     CBC with Differential/Platelet -     Lipase -     Hemoglobin A1c -     CMP14+EGFR  Prediabetes -     Cancel: Hemoglobin A1c -     CBC with Differential/Platelet -     Lipase -     Hemoglobin A1c -     CMP14+EGFR   Exact etiology unclear. Gastritis vs viral gastroenteritis is suspected however cannot exclude pancreatitis/cholecystitis/h.pylori.  Her mother did have a few days of viral GI symptoms last week. She also reports this pain for months as well which could be some IBS or reflux related pain. For now given zofran and lomotil. Will get CBC, CMP, A1C, lipase today. Pt is pre-diabetic concerned for worsening of sugars and maybe causing some GI motility issues. STOP taking any NSAIDs. Keep brat diet. Keep close follow up if symptoms not improving need breath test and start PPI and consider abdominal ultrasound. Follow up in 1 week if symptoms persist.    Follow Up Instructions:    I discussed the assessment and treatment plan with the patient. The patient was provided an opportunity to ask questions and all were answered. The patient agreed with the plan and demonstrated an understanding of the instructions.   The patient was advised to call back or seek an in-person evaluation if the symptoms worsen or if the condition fails to improve as anticipated.  I provided 22 minutes of non-face-to-face time during this encounter.   Iran Planas, PA-C

## 2020-01-27 ENCOUNTER — Encounter: Payer: Self-pay | Admitting: Physician Assistant

## 2020-01-27 NOTE — Telephone Encounter (Signed)
PT notified

## 2020-01-28 ENCOUNTER — Encounter: Payer: Self-pay | Admitting: Physician Assistant

## 2020-01-28 MED ORDER — OMEPRAZOLE 40 MG PO CPDR: 40.0000 mg | DELAYED_RELEASE_CAPSULE | Freq: Every day | ORAL | 3 refills | Status: DC

## 2020-01-28 MED ORDER — METOCLOPRAMIDE HCL 10 MG PO TABS: 5.0000 mg | ORAL_TABLET | Freq: Three times a day (TID) | ORAL | 0 refills | Status: DC | PRN

## 2020-01-28 NOTE — Telephone Encounter (Signed)
Routing to provider  

## 2020-01-29 NOTE — Telephone Encounter (Signed)
Task completed on 01/28/20 by provider.

## 2020-02-03 LAB — CBC WITH DIFFERENTIAL/PLATELET
Basophils Absolute: 0.1 10*3/uL (ref 0.0–0.2)
Basos: 0 %
EOS (ABSOLUTE): 0.3 10*3/uL (ref 0.0–0.4)
Eos: 2 %
Hematocrit: 37.9 % (ref 34.0–46.6)
Hemoglobin: 12.2 g/dL (ref 11.1–15.9)
Immature Grans (Abs): 0 10*3/uL (ref 0.0–0.1)
Immature Granulocytes: 0 %
Lymphocytes Absolute: 4 10*3/uL — ABNORMAL HIGH (ref 0.7–3.1)
Lymphs: 27 %
MCH: 27.2 pg (ref 26.6–33.0)
MCHC: 32.2 g/dL (ref 31.5–35.7)
MCV: 85 fL (ref 79–97)
Monocytes Absolute: 0.5 10*3/uL (ref 0.1–0.9)
Monocytes: 3 %
Neutrophils Absolute: 9.9 10*3/uL — ABNORMAL HIGH (ref 1.4–7.0)
Neutrophils: 68 %
Platelets: 450 10*3/uL (ref 150–450)
RBC: 4.48 x10E6/uL (ref 3.77–5.28)
RDW: 12.1 % (ref 11.7–15.4)
WBC: 14.7 10*3/uL — ABNORMAL HIGH (ref 3.4–10.8)

## 2020-02-03 LAB — CMP14+EGFR
ALT: 28 IU/L (ref 0–32)
AST: 22 IU/L (ref 0–40)
Albumin/Globulin Ratio: 1.3 (ref 1.2–2.2)
Albumin: 4.4 g/dL (ref 3.9–5.0)
Alkaline Phosphatase: 78 IU/L (ref 39–117)
BUN/Creatinine Ratio: 10 (ref 9–23)
BUN: 7 mg/dL (ref 6–20)
Bilirubin Total: <0.2 mg/dL (ref 0.0–1.2)
CO2: 16 mmol/L — ABNORMAL LOW (ref 20–29)
Calcium: 9.8 mg/dL (ref 8.7–10.2)
Chloride: 105 mmol/L (ref 96–106)
Creatinine, Ser: 0.68 mg/dL (ref 0.57–1.00)
GFR calc Af Amer: 147 mL/min/{1.73_m2} (ref >59)
GFR calc non Af Amer: 127 mL/min/{1.73_m2} (ref >59)
Globulin, Total: 3.5 g/dL (ref 1.5–4.5)
Glucose: 96 mg/dL (ref 65–99)
Potassium: 4.3 mmol/L (ref 3.5–5.2)
Sodium: 140 mmol/L (ref 134–144)
Total Protein: 7.9 g/dL (ref 6.0–8.5)

## 2020-02-03 LAB — HEMOGLOBIN A1C
Est. average glucose Bld gHb Est-mCnc: 117 mg/dL
Hgb A1c MFr Bld: 5.7 % — ABNORMAL HIGH (ref 4.8–5.6)

## 2020-02-03 LAB — LIPASE: Lipase: 54 U/L (ref 14–72)

## 2020-02-03 NOTE — Progress Notes (Signed)
Call pt:  Find out how symptoms are. WBC is elevated  Hemoglobin, pancrease and sugar are stable and not the problems. This appears more like possible bacterial cause diarrhea/vomiting.   More diarrhea or upper abdominal pain now?

## 2020-02-04 ENCOUNTER — Telehealth: Payer: Self-pay | Admitting: Physician Assistant

## 2020-02-04 DIAGNOSIS — F419 Anxiety disorder, unspecified: Secondary | ICD-10-CM

## 2020-02-04 DIAGNOSIS — R4586 Emotional lability: Secondary | ICD-10-CM

## 2020-02-04 DIAGNOSIS — F902 Attention-deficit hyperactivity disorder, combined type: Secondary | ICD-10-CM

## 2020-02-04 NOTE — Progress Notes (Signed)
If improving likely more viral gastroenteritis. She is likely fatigued due to all her vomiting and diarrhea. Keep hydrated. Keep a BRAT diet for the next few days. If worsening or running new fever or diarrhea comes back let us know.

## 2020-02-04 NOTE — Telephone Encounter (Signed)
Pt has hx of mood swings and ADHD. Mother reports worsening. She would like referral. Not on any medications.

## 2020-02-16 ENCOUNTER — Other Ambulatory Visit: Payer: Self-pay | Admitting: Physician Assistant

## 2020-02-16 DIAGNOSIS — Z3044 Encounter for surveillance of vaginal ring hormonal contraceptive device: Secondary | ICD-10-CM

## 2020-02-16 DIAGNOSIS — N938 Other specified abnormal uterine and vaginal bleeding: Secondary | ICD-10-CM

## 2020-03-11 ENCOUNTER — Encounter: Payer: Self-pay | Admitting: Physician Assistant

## 2020-03-11 ENCOUNTER — Encounter: Payer: Self-pay | Admitting: Family Medicine

## 2020-03-12 ENCOUNTER — Encounter (HOSPITAL_COMMUNITY): Payer: Self-pay | Admitting: Emergency Medicine

## 2020-03-12 ENCOUNTER — Telehealth (INDEPENDENT_AMBULATORY_CARE_PROVIDER_SITE_OTHER): Payer: BC Managed Care – PPO | Admitting: Medical-Surgical

## 2020-03-12 ENCOUNTER — Emergency Department (HOSPITAL_COMMUNITY)
Admission: EM | Admit: 2020-03-12 | Discharge: 2020-03-12 | Disposition: A | Discharge status: Home / Self Care | Payer: BC Managed Care – PPO | Attending: Emergency Medicine | Admitting: Emergency Medicine

## 2020-03-12 ENCOUNTER — Encounter: Payer: Self-pay | Admitting: Medical-Surgical

## 2020-03-12 DIAGNOSIS — Z20822 Contact with and (suspected) exposure to covid-19: Secondary | ICD-10-CM | POA: Diagnosis not present

## 2020-03-12 DIAGNOSIS — R0602 Shortness of breath: Secondary | ICD-10-CM | POA: Insufficient documentation

## 2020-03-12 DIAGNOSIS — F909 Attention-deficit hyperactivity disorder, unspecified type: Secondary | ICD-10-CM | POA: Insufficient documentation

## 2020-03-12 DIAGNOSIS — E039 Hypothyroidism, unspecified: Secondary | ICD-10-CM | POA: Insufficient documentation

## 2020-03-12 DIAGNOSIS — F419 Anxiety disorder, unspecified: Secondary | ICD-10-CM | POA: Diagnosis not present

## 2020-03-12 DIAGNOSIS — Z79899 Other long term (current) drug therapy: Secondary | ICD-10-CM | POA: Diagnosis not present

## 2020-03-12 DIAGNOSIS — Z5329 Procedure and treatment not carried out because of patient's decision for other reasons: Secondary | ICD-10-CM

## 2020-03-12 NOTE — ED Triage Notes (Signed)
Per [facility] pt from home for SOB, anxiety and panic attacks. Was told by coworker couple weeks ago that he was sick and that everyone is going to get sick.  Vitals: 160/90, 97% onRA, 20R, 77HR.

## 2020-03-12 NOTE — ED Notes (Signed)
Ambulated with pulse ox, 99% during walk, no s/s of [facility]

## 2020-03-12 NOTE — ED Provider Notes (Signed)
[facility]-EMERGENCY DEPT Provider Note   CSN: [id number] Arrival date & time: 03/12/20  1144     History Chief Complaint  Patient presents with  . Shortness of Breath  . Anxiety    Carrie Spears is a 19 y.o. female past medical history significant for ADHD presents to emergency department today via EMS with chief complaint of shortness of breath, anxiety and panic attacks.  Patient states her symptoms have progressively worsened over the last 4 days.  She is also endorsing nasal congestion.  She has tried over-the-counter sinus medications with minimal symptom improvement.  She states she feels short of breath at rest and it is not worse with exertion.  He also endorses being under increased stress lately sinus lot of tension in her household.  She does say she feels safe at home.  She denies fever, chills, cough, sore throat, sinus pressure, chest pain, abdominal pain, nausea, vomiting, loss of sense of taste and smell.  Patient admits to being in close contact with a sick coworker.  She is unsure what illness he had.  She has not had Covid vaccinations.  Past Medical History:  Diagnosis Date  . ADHD (attention deficit hyperactivity disorder)     Patient Active Problem List   Diagnosis Date Noted  . Epigastric abdominal pain 01/26/2020  . Nausea and vomiting 01/26/2020  . Diarrhea 01/26/2020  . Pregestational diabetes mellitus, modified White class F ([facility]) 01/26/2020  . Cervicitis and endocervicitis 08/08/2019  . Adnexal tenderness 08/08/2019  . Low energy 01/14/2019  . DUB (dysfunctional uterine bleeding) 07/08/2018  . Palpitations 07/08/2018  . Iron deficiency anemia 07/08/2018  . Neck pain 12/09/2017  . Reactive airway disease 12/15/2016  . Vitamin D deficiency 02/08/2016  . Elevated WBC count 02/08/2016  . Hypothyroidism 02/08/2016  . Morbid obesity ([facility]) 02/02/2016  . ADD (attention deficit disorder) 02/02/2016  . Other fatigue 02/02/2016  .  Thyromegaly 02/02/2016  . Dysuria 02/02/2016  . Prediabetes 12/28/2014  . Mood swings 12/28/2014  . 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 No pertinent surgical history.   OB History   No obstetric history on file.     Family History  Problem Relation Age of Onset  . Hypertension Mother   . Lupus Maternal Aunt   . Diabetes Maternal Aunt     Social History   Tobacco Use  . Smoking status: Never Smoker  . Smokeless tobacco: Never Used  Substance Use Topics  . Alcohol use: No    Alcohol/week: 0.0 standard drinks  . Drug use: No    Home Medications Prior to Admission medications   Medication Sig Start Date End Date Taking? Authorizing Provider  diphenoxylate-atropine (LOMOTIL) 2.5-0.025 MG tablet One to 2 tablets by mouth 4 times a day as needed for diarrhea. 01/26/20   Breeback, Lonna Cobb, PA-C  etonogestrel-ethinyl estradiol (NUVARING) 0.12-0.015 MG/24HR vaginal ring INSERT VAGINALLY AND LEAVE IN PLACE FOR 3 CONSECUTIVE WEEKS, THEN REMOVE FOR 1 WEEK 02/16/20   Breeback, Jade L, PA-C  metoCLOPramide (REGLAN) 10 MG tablet Take 0.5 tablets (5 mg total) by mouth every 8 (eight) hours as needed for nausea or vomiting (nausea/reflux). 01/28/20   Breeback, Lonna Cobb, PA-C  omeprazole (PRILOSEC) 40 MG capsule Take 1 capsule (40 mg total) by mouth daily. 01/28/20   Breeback, Jade L, PA-C  ondansetron (ZOFRAN-ODT) 8 MG disintegrating tablet Take 1 tablet (8 mg total) by mouth every 8 (eight) hours as needed for nausea. 01/26/20  Breeback, Jade L, PA-C    Allergies    Patient has no known allergies.  Review of Systems   Review of Systems  All other systems are reviewed and are negative for acute change except as noted in the HPI.   Physical Exam Updated Vital Signs BP 134/83   Pulse 86   Temp 98.3 F (36.8 C)   Resp 16   Ht 5\' 6"  (1.676 m)   Wt 122.8 kg   SpO2 99%   BMI 43.71 kg/m   Physical Exam Vitals and nursing note  reviewed.  Constitutional:      Appearance: She is well-developed. She is not ill-appearing or toxic-appearing.  HENT:     Head: Normocephalic and atraumatic.     Comments: No sinus or temporal tenderness.    Nose: Congestion present.     Mouth/Throat:     Mouth: Mucous membranes are moist.     Pharynx: Oropharynx is clear. No oropharyngeal exudate or posterior oropharyngeal erythema.  Eyes:     General: No scleral icterus.       Right eye: No discharge.        Left eye: No discharge.     Conjunctiva/sclera: Conjunctivae normal.  Neck:     Vascular: No JVD.  Cardiovascular:     Rate and Rhythm: Normal rate and regular rhythm.     Pulses: Normal pulses.     Heart sounds: Normal heart sounds.  Pulmonary:     Effort: Pulmonary effort is normal.     Breath sounds: Normal breath sounds.     Comments: Lungs are clear to auscultation all fields.  She has normal work of breathing.  Oxygen saturation is 99% on room air.  She is speaking in full sentences. Chest:     Chest wall: No tenderness.  Abdominal:     General: There is no distension.  Musculoskeletal:        General: Normal range of motion.     Cervical back: Normal range of motion.     Right lower leg: No edema.     Left lower leg: No edema.  Skin:    General: Skin is warm and dry.  Neurological:     Mental Status: She is oriented to person, place, and time.     GCS: GCS eye subscore is 4. GCS verbal subscore is 5. GCS motor subscore is 6.     Comments: Fluent speech, no facial droop.  Psychiatric:        Behavior: Behavior normal.     ED Results / Procedures / Treatments   Labs (all labs ordered are listed, but only abnormal results are displayed) Labs Reviewed  SARS CORONAVIRUS 2 (TAT 6-24 HRS)    EKG None  Radiology No results found.  Procedures Procedures (including critical care time)  Medications Ordered in ED Medications - No data to display  ED Course  I have reviewed the triage vital signs and  the nursing notes.  Pertinent labs & imaging results that were available during my care of the patient were reviewed by me and considered in my medical decision making (see chart for details).    MDM Rules/Calculators/A&P                      I have reviewed patient's EMR to obtain pertinent PMH to assist in MDM.  Symptoms and exam most suggestive of uncomplicated viral illness. DDX incluldes viral URI/LRI, COVID-19.  No travel. No known exposures to  confirmed COVID-19.    Exam is benign.  Normal WOB. No fever, tachypnea, tachycardia, hypoxemia. Lungs are CTAB. I do not think that a CXR is indicated at this time as VS are WNL, there are no signs of consolidation on auscultation and there is no hypoxia, increased WOB or other concerning features to exam. No significant h/o immunocompromise. Doubt bacterial bronchitis or pneumonia.  No signs or symptoms to suggest strep pharyngitis.  No clinical signs of severe illness, dehydration, to warrant further emergent work up in ER. PCR covid test collected and is pending. Patient ambulated in the emergency department without respiratory distress or hypoxia, SpO2 >95% on room air.  Given reassuring physical exam, symptoms, will discharge with symptomatic treatment. Recommend telemedicine PCP f/u in the next 2-3 days for persistent symptoms  for further guidance. Self-isolation instructions discussed. Pt was given home self-isolation instructions and instructions for family members.   Pt understands signs and symptoms that would warrant return to ED.  Pt comfortable and agreeable with POC.  Carrie Spears was evaluated in Emergency Department on 03/12/2020 for the symptoms described in the history of present illness. She was evaluated in the context of the global COVID-19 pandemic, which necessitated consideration that the patient might be at risk for infection with the SARS-CoV-2 virus that causes COVID-19. Institutional protocols and algorithms that pertain  to the evaluation of patients at risk for COVID-19 are in a state of rapid change based on information released by regulatory bodies including the CDC and federal and state organizations. These policies and algorithms were followed during the patient's care in the ED.   Portions of this note were generated with [employer]. Dictation errors may occur despite best attempts at proofreading.  Final Clinical Impression(s) / ED Diagnoses Final diagnoses:  Shortness of breath    Rx / DC Orders ED Discharge Orders    None       Flint Melter 03/12/20 1514    Wyvonnia Dusky, MD 03/12/20 1819

## 2020-03-12 NOTE — ED Triage Notes (Signed)
reports all these symptoms and not feeling well started on Tuesday.

## 2020-03-12 NOTE — Discharge Instructions (Signed)
You were seen in the ED for shortness of breath.  I suspect you have a virus. We tested your for COVID-19 (coronavirus) infection.  It is also possible you could have other viral upper respiratory infection from another virus.    Test results come back in 48 hours, sometimes sooner.  Someone will call you if you are positive for COVID. If the result is negative you can see it on your MyChart.  Treatment of your illness and symptoms will include self-isolation, monitoring of symptoms and supportive care with over-the-counter medicines.    Return to the ED if there is increased work of breathing, shortness of breath, inability to tolerate fluids, weakness, chest pain.  If your test results are POSITIVE, the following isolation requirements need to be met to return to work and resume essential activities: At least 10 days since symptom onset  72 hours of absence of fever without antifever medicine (ibuprofen, acetaminophen). A fever is temperature of 100.27F or greater. Improvement of respiratory symptoms  If your test is NEGATIVE, you may return to work and essential activities as long as your symptoms have improved and you do not have a fever for a total of 3 days.  Call your job and notify them that your test result was negative to see if they will allow you to return to work.   Stay well-hydrated. Rest. You can use over the counter medications to help with symptoms: 600 mg ibuprofen (motrin, aleve, advil) or acetaminophen (tylenol) every 6 hours, around the clock to help with associated fevers, sore throat, headaches, generalized body aches and malaise.  Oxymetazoline (afrin) intranasal spray once daily for no more than 3 days to help with congestion, after 3 days you can switch to another over-the-counter nasal steroid spray such as fluticasone (flonase) Allergy medication (loratadine, cetirizine, etc) and phenylephrine (sudafed) help with nasal congestion, runny nose and postnasal drip.     Dextromethorphan (Delsym) to suppress dry cough. Frequent coughing is likely causing your chest wall pain Wash your hands often to prevent spread.

## 2020-03-13 LAB — SARS CORONAVIRUS 2 (TAT 6-24 HRS): SARS Coronavirus 2: NEGATIVE

## 2020-03-13 NOTE — Progress Notes (Signed)
No show for appointment 03/12/2020

## 2020-04-22 ENCOUNTER — Encounter: Payer: Self-pay | Admitting: Nurse Practitioner

## 2020-04-22 ENCOUNTER — Telehealth (INDEPENDENT_AMBULATORY_CARE_PROVIDER_SITE_OTHER): Payer: BC Managed Care – PPO | Admitting: Nurse Practitioner

## 2020-04-22 VITALS — BP 148/86 | HR 78

## 2020-04-22 DIAGNOSIS — L732 Hidradenitis suppurativa: Secondary | ICD-10-CM | POA: Diagnosis not present

## 2020-04-22 MED ORDER — DOXYCYCLINE HYCLATE 100 MG PO TABS: 100.0000 mg | ORAL_TABLET | Freq: Two times a day (BID) | ORAL | 0 refills | Status: DC

## 2020-04-22 NOTE — Progress Notes (Signed)
Virtual Video Visit via MyChart Note  I connected with  Carrie Spears on 04/22/20 at 11:20 AM EDT by the video enabled telemedicine application for , MyChart, and verified that I am speaking with the correct person using two identifiers.   I introduced myself as a Publishing rights manager with the practice. We discussed the limitations of evaluation and management by telemedicine and the availability of in person appointments. The patient expressed understanding and agreed to proceed.  The patient is: at home I am: in the office  Subjective:    CC:  Chief Complaint  Patient presents with   Ingrown Hair    has several of them but is most concerned about the one under her right arm    HPI: Carrie Spears is a 19 y.o. y/o female presenting via MyChart today for the presence of suspected ingrown hairs, one located on her right axilla and one located just distal to the left axilla lateral to the left breast. She reports that she gets these infected hair follicles from time to time.   She reports the right axillary nodule is the worst and is very painful and inflamed. She states she tends to sleep on her right side and this causes more pain. The punctum is apparent and she believes there is a small amount of drainage coming from the area. She reports the drainage is malodorous and she can smell it even with her arms down. She has been keeping a bandage over the area to help catch any drainage and reduce the odor. She states that the area involved is about 3 cm in diameter and is very tender to the touch.   She reports the left axillary nodule is not as inflamed or painful and is only about 1 cm in diameter and is not draining. She does not think there is a visible punctum in this area.   She has not attempted to squeeze either area. She denies fever, chills, nausea, vomiting, diarrhea, or frequent recurrent similar problems in the axilla or groin. She denies increased acne.   Past medical history,  Surgical history, Family history not pertinant except as noted below, Social history, Allergies, and medications have been entered into the medical record, reviewed, and corrections made.   Review of Systems:  See HPI for pertinent positive and negatives  Objective:    General: Speaking clearly in complete sentences without any shortness of breath.   Alert and oriented x3.   Normal judgment.  No apparent acute distress.  Right axilla visualized- approximate 3cm nodular appearing area with centralized punctum seen. No significant erythema detected. No cellulitis detected to the surrounding tissue.   Impression and Recommendations:    1. Axillary hidradenitis suppurativa Symptoms and presentation consistent with hidradenitis suppurativa. She does not seem to have frequent outbreaks so at this time, we will defer aggressive management, but we did discuss the option of management if her symptoms worsen or become more frequent. Discussed measures to help facilitate drainage and prevention of recurrence. We also discussed that she may need to come in to the office for incision and drainage if problem persists despite treatment.   PLAN: - doxycycline (VIBRA-TABS) 100 MG tablet; Take 1 tablet (100 mg total) by mouth 2 (two) times daily.  Dispense: 14 tablet; Refill: 0 - Warm compress to the area several times a day - DO NOT SQUEEZE - May apply topical antibiotic ointment for 2-3 days to see if this helps with symptoms. - May apply Differin gel or benzoyl  peroxide cream to the current areas or new areas to help with drainage and cleaning out the pores.  - I&D may be needed if antibiotics and supportive treatment is not effective.  - Follow-up if symptoms worsen or fail to improve.     I discussed the assessment and treatment plan with the patient. The patient was provided an opportunity to ask questions and all were answered. The patient agreed with the plan and demonstrated an understanding of  the instructions.   The patient was advised to call back or seek an in-person evaluation if the symptoms worsen or if the condition fails to improve as anticipated.  I provided 20 minutes of non-face-to-face interaction with this MYCHART visit including intake, same-day documentation, and chart review.   Tollie Eth, NP

## 2020-04-22 NOTE — Patient Instructions (Addendum)
You may consider using an over the counter topical medication for acne called Differin gel or an acne medication with benzoyl peroxide when you first notice inflammation in the area to help prevent it from getting worse. This may not work all of the time, but may help you have less outbreaks.    Hidradenitis Suppurativa Hidradenitis suppurativa is a long-term (chronic) skin disease. It is similar to a severe form of acne, but it affects areas of the body where acne would be unusual, especially areas of the body where skin rubs against skin and becomes moist. These include:  Underarms.  Groin.  Genital area.  Buttocks.  Upper thighs.  Breasts. Hidradenitis suppurativa may start out as small lumps or pimples caused by blocked sweat glands or hair follicles. Pimples may develop into deep sores that break open (rupture) and drain pus. Over time, affected areas of skin may thicken and become scarred. This condition is rare and does not spread from person to person (non-contagious). What are the causes? The exact cause of this condition is not known. It may be related to:  Female and female hormones.  An overactive disease-fighting system (immune system). The immune system may over-react to blocked hair follicles or sweat glands and cause swelling and pus-filled sores. What increases the risk? You are more likely to develop this condition if you:  Are female.  Are 90-40 years old.  Have a family history of hidradenitis suppurativa.  Have a personal history of acne.  Are overweight.  Smoke.  Take the medicine lithium. What are the signs or symptoms? The first symptoms are usually painful bumps in the skin, similar to pimples. The condition may get worse over time (progress), or it may only cause mild symptoms. If the disease progresses, symptoms may include:  Skin bumps getting bigger and growing deeper into the skin.  Bumps rupturing and draining pus.  Itchy, infected  skin.  Skin getting thicker and scarred.  Tunnels under the skin (fistulas) where pus drains from a bump.  Pain during daily activities, such as pain during walking if your groin area is affected.  Emotional problems, such as stress or depression. This condition may affect your appearance and your ability or willingness to wear certain clothes or do certain activities. How is this diagnosed? This condition is diagnosed by a health care provider who specializes in skin diseases (dermatologist). You may be diagnosed based on:  Your symptoms and medical history.  A physical exam.  Testing a pus sample for infection.  Blood tests. How is this treated? Your treatment will depend on how severe your symptoms are. The same treatment will not work for everybody with this condition. You may need to try several treatments to find what works best for you. Treatment may include:  Cleaning and bandaging (dressing) your wounds as needed.  Lifestyle changes, such as new skin care routines.  Taking medicines, such as: ? Antibiotics. ? Acne medicines. ? Medicines to reduce the activity of the immune system. ? A diabetes medicine (metformin). ? Birth control pills, for women. ? Steroids to reduce swelling and pain.  Working with a mental health care provider, if you experience emotional distress due to this condition. If you have severe symptoms that do not get better with medicine, you may need surgery. Surgery may involve:  Using a laser to clear the skin and remove hair follicles.  Opening and draining deep sores.  Removing the areas of skin that are diseased and scarred. Follow these instructions  at home: Medicines   Take over-the-counter and prescription medicines only as told by your health care provider.  If you were prescribed an antibiotic medicine, take it as told by your health care provider. Do not stop taking the antibiotic even if your condition improves. Skin care  If  you have open wounds, cover them with a clean dressing as told by your health care provider. Keep wounds clean by washing them gently with soap and water when you bathe.  Do not shave the areas where you get hidradenitis suppurativa.  Do not wear deodorant.  Wear loose-fitting clothes.  Try to avoid getting overheated or sweaty. If you get sweaty or wet, change into clean, dry clothes as soon as you can.  To help relieve pain and itchiness, cover sore areas with a warm, clean washcloth (warm compress) for 5-10 minutes as often as needed.  If told by your health care provider, take a bleach bath twice a week: ? Fill your bathtub halfway with water. ? Pour in  cup of unscented household bleach. ? Soak in the tub for 5-10 minutes. ? Only soak from the neck down. Avoid water on your face and hair. ? Shower to rinse off the bleach from your skin. General instructions  Learn as much as you can about your disease so that you have an active role in your treatment. Work closely with your health care provider to find treatments that work for you.  If you are overweight, work with your health care provider to lose weight as recommended.  Do not use any products that contain nicotine or tobacco, such as cigarettes and e-cigarettes. If you need help quitting, ask your health care provider.  If you struggle with living with this condition, talk with your health care provider or work with a mental health care provider as recommended.  Keep all follow-up visits as told by your health care provider. This is important. Where to find more information  Hidradenitis Suppurativa Foundation[employer].: https://www.hs-foundation.org/ Contact a health care provider if you have:  A flare-up of hidradenitis suppurativa.  A fever or chills.  Trouble controlling your symptoms at home.  Trouble doing your daily activities because of your symptoms.  Trouble dealing with emotional problems related to your  condition. Summary  Hidradenitis suppurativa is a long-term (chronic) skin disease. It is similar to a severe form of acne, but it affects areas of the body where acne would be unusual.  The first symptoms are usually painful bumps in the skin, similar to pimples. The condition may get worse over time (progress), or it may only cause mild symptoms.  If you have open wounds, cover them with a clean dressing as told by your health care provider. Keep wounds clean by washing them gently with soap and water when you bathe.  Besides skin care, treatment may include medicines, laser treatment, and surgery. This information is not intended to replace advice given to you by your health care provider. Make sure you discuss any questions you have with your health care provider. Document Revised: 10/10/2017 Document Reviewed: 10/10/2017 Elsevier Patient Education  2020 [employer].

## 2020-05-09 ENCOUNTER — Encounter: Payer: Self-pay | Admitting: Physician Assistant

## 2020-05-09 ENCOUNTER — Encounter: Payer: Self-pay | Admitting: Medical-Surgical

## 2020-05-10 ENCOUNTER — Telehealth (INDEPENDENT_AMBULATORY_CARE_PROVIDER_SITE_OTHER): Payer: BC Managed Care – PPO | Admitting: Family Medicine

## 2020-05-10 ENCOUNTER — Encounter: Payer: Self-pay | Admitting: Family Medicine

## 2020-05-10 DIAGNOSIS — J069 Acute upper respiratory infection, unspecified: Secondary | ICD-10-CM | POA: Diagnosis not present

## 2020-05-10 MED ORDER — BENZONATATE 100 MG PO CAPS
100.0000 mg | ORAL_CAPSULE | Freq: Three times a day (TID) | ORAL | 0 refills | Status: DC | PRN
Start: 2020-05-10 — End: 2020-05-20

## 2020-05-10 MED ORDER — PREDNISONE 20 MG PO TABS: 20.0000 mg | ORAL_TABLET | Freq: Two times a day (BID) | ORAL | 0 refills | Status: AC

## 2020-05-10 NOTE — Progress Notes (Signed)
Carrie Spears - 19 y.o. female MRN [id number]  Date of birth: Mar 04, 2001   This visit type was conducted due to national recommendations for restrictions regarding the COVID-19 Pandemic (e.g. social distancing).  This format is felt to be most appropriate for this patient at this time.  All issues noted in this document were discussed and addressed.  No physical exam was performed (except for noted visual exam findings with Video Visits).  I discussed the limitations of evaluation and management by telemedicine and the availability of in person appointments. The patient expressed understanding and agreed to proceed.  I connected with@ on 05/10/20 at  3:00 PM EDT by a video enabled telemedicine application and verified that I am speaking with the correct person using two identifiers.  Present at visit: Everrett Coombe, DO [name]   Patient Location: Home [address] [place] [address]   Provider location:   [facility]  Chief Complaint  Patient presents with  . Nasal Congestion    HPI  Carrie Spears is a 19 y.o. female who presents via audio/video conferencing for a telehealth visit today.  She reports 2-3 day history of nasal congestion, cough, chest congestion with green phlegm.  She denies fever, chills, body aches, chest pain, or shortness of breath.  She denies GI symptoms.   Her mother and sister have had similar symptoms.  She has tried decongestant with some improvement.     ROS:  A comprehensive ROS was completed and negative except as noted per HPI  Past Medical History:  Diagnosis Date  . ADHD (attention deficit hyperactivity disorder)     History reviewed. No pertinent surgical history.  Family History  Problem Relation Age of Onset  . Hypertension Mother   . Lupus Maternal Aunt   . Diabetes Maternal Aunt     Social History   Socioeconomic History  . Marital status: Single    Spouse name: Not on file  . Number of children: Not on file  .  Years of education: Not on file  . Highest education level: Not on file  Occupational History  . Not on file  Tobacco Use  . Smoking status: Never Smoker  . Smokeless tobacco: Never Used  Substance and Sexual Activity  . Alcohol use: No    Alcohol/week: 0.0 standard drinks  . Drug use: No  . Sexual activity: Never  Other Topics Concern  . Not on file  Social History Narrative  . Not on file   Social Determinants of Health   Financial Resource Strain:   . Difficulty of Paying Living Expenses:   Food Insecurity:   . Worried About Programme researcher, broadcasting/film/video in the Last Year:   . Barista in the Last Year:   Transportation Needs:   . Freight forwarder (Medical):   Marland Kitchen Lack of Transportation (Non-Medical):   Physical Activity:   . Days of Exercise per Week:   . Minutes of Exercise per Session:   Stress:   . Feeling of Stress :   Social Connections:   . Frequency of Communication with Friends and Family:   . Frequency of Social Gatherings with Friends and Family:   . Attends Religious Services:   . Active Member of Clubs or Organizations:   . Attends Banker Meetings:   Marland Kitchen Marital Status:   Intimate Partner Violence:   . Fear of Current or Ex-Partner:   . Emotionally Abused:   Marland Kitchen Physically Abused:   .  Sexually Abused:      Current Outpatient Medications:  .  benzonatate (TESSALON) 100 MG capsule, Take 1 capsule (100 mg total) by mouth 3 (three) times daily as needed for cough., Disp: 30 capsule, Rfl: 0 .  diphenoxylate-atropine (LOMOTIL) 2.5-0.025 MG tablet, One to 2 tablets by mouth 4 times a day as needed for diarrhea. (Patient not taking: Reported on 04/22/2020), Disp: 30 tablet, Rfl: 0 .  doxycycline (VIBRA-TABS) 100 MG tablet, Take 1 tablet (100 mg total) by mouth 2 (two) times daily., Disp: 14 tablet, Rfl: 0 .  etonogestrel-ethinyl estradiol (NUVARING) 0.12-0.015 MG/24HR vaginal ring, INSERT VAGINALLY AND LEAVE IN PLACE FOR 3 CONSECUTIVE WEEKS, THEN  REMOVE FOR 1 WEEK, Disp: 3 each, Rfl: 3 .  metoCLOPramide (REGLAN) 10 MG tablet, Take 0.5 tablets (5 mg total) by mouth every 8 (eight) hours as needed for nausea or vomiting (nausea/reflux). (Patient not taking: Reported on 04/22/2020), Disp: 30 tablet, Rfl: 0 .  omeprazole (PRILOSEC) 40 MG capsule, Take 1 capsule (40 mg total) by mouth daily. (Patient not taking: Reported on 04/22/2020), Disp: 30 capsule, Rfl: 3 .  ondansetron (ZOFRAN-ODT) 8 MG disintegrating tablet, Take 1 tablet (8 mg total) by mouth every 8 (eight) hours as needed for nausea. (Patient not taking: Reported on 04/22/2020), Disp: 20 tablet, Rfl: 1 .  predniSONE (DELTASONE) 20 MG tablet, Take 1 tablet (20 mg total) by mouth 2 (two) times daily with a meal for 5 days., Disp: 10 tablet, Rfl: 0  EXAM:  VITALS per patient if applicable: BP (!) 141/80   Pulse 81   GENERAL: alert, oriented, appears well and in no acute distress  HEENT: atraumatic, conjunttiva clear, no obvious abnormalities on inspection of external nose and ears  NECK: normal movements of the head and neck  LUNGS: on inspection no signs of respiratory distress, breathing rate appears normal, no obvious gross SOB, gasping or wheezing  CV: no obvious cyanosis  MS: moves all visible extremities without noticeable abnormality  PSYCH/NEURO: pleasant and cooperative, no obvious depression or anxiety, speech and thought processing grossly intact  ASSESSMENT AND PLAN:  Discussed the following assessment and plan:  Viral URI Recommend supportive care.  Push fluids with rest. She may continue decongestant and add expectorant.  Will add prednisone burst for chest congestion and history of reactive airway disease. Tessalon perles for cough.  Instructed to follow up if having new or worsening symptoms.    30 minutes spent including pre visit preparation, review of prior notes and labs, encounter with patient via video visit and same day documentation.     I  discussed the assessment and treatment plan with the patient. The patient was provided an opportunity to ask questions and all were answered. The patient agreed with the plan and demonstrated an understanding of the instructions.   The patient was advised to call back or seek an in-person evaluation if the symptoms worsen or if the condition fails to improve as anticipated.    Everrett Coombe, DO

## 2020-05-10 NOTE — Progress Notes (Signed)
Sx: Chest congestion x 4 days Coughing, green mucus  Medications: Severe cough and congestion Chest congestion relief

## 2020-05-10 NOTE — Assessment & Plan Note (Addendum)
Recommend supportive care.  Push fluids with rest. She may continue decongestant and add expectorant.  Will add prednisone burst for chest congestion and history of reactive airway disease. Tessalon perles for cough.  Instructed to follow up if having new or worsening symptoms.

## 2020-05-11 ENCOUNTER — Encounter: Payer: Self-pay | Admitting: Family Medicine

## 2020-05-12 NOTE — Telephone Encounter (Signed)
Previous letter dated from 05/09/20 - 05/10/20. Pls advise, thanks.

## 2020-05-19 ENCOUNTER — Encounter: Payer: Self-pay | Admitting: Physician Assistant

## 2020-05-19 NOTE — Telephone Encounter (Signed)
I would be ok with working her in even today but come right at 2:15ish.

## 2020-05-20 ENCOUNTER — Other Ambulatory Visit: Payer: Self-pay

## 2020-05-20 ENCOUNTER — Encounter: Payer: Self-pay | Admitting: Nurse Practitioner

## 2020-05-20 ENCOUNTER — Ambulatory Visit (INDEPENDENT_AMBULATORY_CARE_PROVIDER_SITE_OTHER): Payer: BC Managed Care – PPO | Admitting: Nurse Practitioner

## 2020-05-20 VITALS — BP 106/69 | HR 94 | Temp 98.1°F | Ht 66.0 in | Wt 263.7 lb

## 2020-05-20 DIAGNOSIS — L309 Dermatitis, unspecified: Secondary | ICD-10-CM

## 2020-05-20 MED ORDER — TRIAMCINOLONE ACETONIDE 0.1 % EX CREA: 1.0000 "application " | TOPICAL_CREAM | Freq: Three times a day (TID) | CUTANEOUS | 1 refills | Status: DC

## 2020-05-20 MED ORDER — MUPIROCIN 2 % EX OINT: TOPICAL_OINTMENT | CUTANEOUS | 3 refills | Status: DC

## 2020-05-20 NOTE — Patient Instructions (Signed)
Contact Dermatitis Dermatitis is redness, soreness, and swelling (inflammation) of the skin. Contact dermatitis is a reaction to certain substances that touch the skin. Many different substances can cause contact dermatitis. There are two types of contact dermatitis:  Irritant contact dermatitis. This type is caused by something that irritates your skin, such as having dry hands from washing them too often with soap. This type does not require previous exposure to the substance for a reaction to occur. This is the most common type.  Allergic contact dermatitis. This type is caused by a substance that you are allergic to, such as poison ivy. This type occurs when you have been exposed to the substance (allergen) and develop a sensitivity to it. Dermatitis may develop soon after your first exposure to the allergen, or it may not develop until the next time you are exposed and every time thereafter. What are the causes? Irritant contact dermatitis is most commonly caused by exposure to:  Makeup.  Soaps.  Detergents.  Bleaches.  Acids.  Metal salts, such as nickel. Allergic contact dermatitis is most commonly caused by exposure to:  Poisonous plants.  Chemicals.  Jewelry.  Latex.  Medicines.  Preservatives in products, such as clothing. What increases the risk? You are more likely to develop this condition if you have:  A job that exposes you to irritants or allergens.  Certain medical conditions, such as asthma or eczema. What are the signs or symptoms? Symptoms of this condition may occur on your body anywhere the irritant has touched you or is touched by you.  Symptoms include: ? Dryness or flaking. ? Redness. ? Cracks. ? Itching. ? Pain or a burning feeling. ? Blisters. ? Drainage of small amounts of blood or clear fluid from skin cracks. With allergic contact dermatitis, there may also be swelling in areas such as the eyelids, mouth, or genitals. How is this  diagnosed? This condition is diagnosed with a medical history and physical exam.  A patch skin test may be performed to help determine the cause.  If the condition is related to your job, you may need to see an occupational medicine specialist. How is this treated? This condition is treated by checking for the cause of the reaction and protecting your skin from further contact. Treatment may also include:  Steroid creams or ointments. Oral steroid medicines may be needed in more severe cases.  Antibiotic medicines or antibacterial ointments, if a skin infection is present.  Antihistamine lotion or an antihistamine taken by mouth to ease itching.  A bandage (dressing). Follow these instructions at home: Skin care  Moisturize your skin as needed.  Apply cool compresses to the affected areas.  Try applying baking soda paste to your skin. Stir water into baking soda until it reaches a paste-like consistency.  Do not scratch your skin, and avoid friction to the affected area.  Avoid the use of soaps, perfumes, and dyes. Medicines  Take or apply over-the-counter and prescription medicines only as told by your health care provider.  If you were prescribed an antibiotic medicine, take or apply the antibiotic as told by your health care provider. Do not stop using the antibiotic even if your condition improves. Bathing  Try taking a bath with: ? Epsom salts. Follow the instructions on the packaging. You can get these at your local pharmacy or grocery store. ? Baking soda. Pour a small amount into the bath as directed by your health care provider. ? Colloidal oatmeal. Follow the instructions on the  packaging. You can get this at your local pharmacy or grocery store.  Bathe less frequently, such as every other day.  Bathe in lukewarm water. Avoid using hot water. Bandage care  If you were given a bandage (dressing), change it as told by your health care provider.  Wash your hands  with soap and water before and after you change your dressing. If soap and water are not available, use hand sanitizer. General instructions  Avoid the substance that caused your reaction. If you do not know what caused it, keep a journal to try to track what caused it. Write down: ? What you eat. ? What cosmetic products you use. ? What you drink. ? What you wear in the affected area. This includes jewelry.  Check the affected areas every day for signs of infection. Check for: ? More redness, swelling, or pain. ? More fluid or blood. ? Warmth. ? Pus or a bad smell.  Keep all follow-up visits as told by your health care provider. This is important. Contact a health care provider if:  Your condition does not improve with treatment.  Your condition gets worse.  You have signs of infection such as swelling, tenderness, redness, soreness, or warmth in the affected area.  You have a fever.  You have new symptoms. Get help right away if:  You have a severe headache, neck pain, or neck stiffness.  You vomit.  You feel very sleepy.  You notice red streaks coming from the affected area.  Your bone or joint underneath the affected area becomes painful after the skin has healed.  The affected area turns darker.  You have difficulty breathing. Summary  Dermatitis is redness, soreness, and swelling (inflammation) of the skin. Contact dermatitis is a reaction to certain substances that touch the skin.  Symptoms of this condition may occur on your body anywhere the irritant has touched you or is touched by you.  This condition is treated by figuring out what caused the reaction and protecting your skin from further contact. Treatment may also include medicines and skin care.  Avoid the substance that caused your reaction. If you do not know what caused it, keep a journal to try to track what caused it.  Contact a health care provider if your condition gets worse or you have signs  of infection such as swelling, tenderness, redness, soreness, or warmth in the affected area. This information is not intended to replace advice given to you by your health care provider. Make sure you discuss any questions you have with your health care provider. Document Revised: 01/22/2019 Document Reviewed: 04/17/2018 Elsevier Patient Education  2020 [employer]   Genital Herpes Genital herpes is a common sexually transmitted infection (STI) that is caused by a virus. The virus spreads from person to person through sexual contact. Infection can cause itching, blisters, and sores around the genitals or rectum. Symptoms may last several days and then go away This is called an outbreak. However, the virus remains in your body, so you may have more outbreaks in the future. The time between outbreaks varies and can be months or years. Genital herpes affects men and women. It is particularly concerning for pregnant women because the virus can be passed to the baby during delivery and can cause serious problems. Genital herpes is also a concern for people who have a weak disease-fighting (immune) system. What are the causes? This condition is caused by the herpes simplex virus (HSV) type 1 or type 2.  The virus may spread through:  Sexual contact with an infected person, including vaginal, anal, and oral sex.  Contact with fluid from a herpes sore.  The skin. This means that you can get herpes from an infected partner even if he or she does not have a visible sore or does not know that he or she is infected. What increases the risk? You are more likely to develop this condition if:  You have sex with many partners.  You do not use latex condoms during sex. What are the signs or symptoms? Most people do not have symptoms (asymptomatic) or have mild symptoms that may be mistaken for other skin problems. Symptoms may include:  Small red bumps near the genitals, rectum, or mouth. These bumps  turn into blisters and then turn into sores.  Flu-like symptoms, including: ? Fever. ? Body aches. ? Swollen lymph nodes. ? Headache.  Painful urination.  Pain and itching in the genital area or rectal area.  Vaginal discharge.  Tingling or shooting pain in the legs and buttocks. Generally, symptoms are more severe and last longer during the first (primary) outbreak. Flu-like symptoms are also more common during the primary outbreak. How is this diagnosed? Genital herpes may be diagnosed based on:  A physical exam.  Your medical history.  Blood tests.  A test of a fluid sample (culture) from an open sore. How is this treated? There is no cure for this condition, but treatment with antiviral medicines that are taken by mouth (orally) can do the following:  Speed up healing and relieve symptoms.  Help to reduce the spread of the virus to sexual partners.  Limit the chance of future outbreaks, or make future outbreaks shorter.  Lessen symptoms of future outbreaks. Your health care provider may also recommend pain relief medicines, such as aspirin or ibuprofen. Follow these instructions at home: Sexual activity  Do not have sexual contact during active outbreaks.  Practice safe sex. Latex condoms and female condoms may help prevent the spread of the herpes virus. General instructions  Keep the affected areas dry and clean.  Take over-the-counter and prescription medicines only as told by your health care provider.  Avoid rubbing or touching blisters and sores. If you do touch blisters or sores: ? Wash your hands thoroughly with soap and water. ? Do not touch your eyes afterward.  To help relieve pain or itching, you may take the following actions as directed by your health care provider: ? Apply a cold, wet cloth (cold compress) to affected areas 4-6 times a day. ? Apply a substance that protects your skin and reduces bleeding (astringent). ? Apply a gel that helps  relieve pain around sores (lidocaine gel). ? Take a warm, shallow bath that cleans the genital area (sitz bath).  Keep all follow-up visits as told by your health care provider. This is important. How is this prevented?  Use condoms. Although anyone can get genital herpes during sexual contact, even with the use of a condom, a condom can provide some protection.  Avoid having multiple sexual partners.  Talk with your sexual partner about any symptoms either of you may have. Also, talk with your partner about any history of STIs.  Get tested for STIs before you have sex. Ask your partner to do the same.  Do not have sexual contact if you have symptoms of genital herpes. Contact a health care provider if:  Your symptoms are not improving with medicine.  Your symptoms return.  You have new symptoms.  You have a fever.  You have abdominal pain.  You have redness, swelling, or pain in your eye.  You notice new sores on other parts of your body.  You are a woman and experience bleeding between menstrual periods.  You have had herpes and you become pregnant or plan to become pregnant. Summary  Genital herpes is a common sexually transmitted infection (STI) that is caused by the herpes simplex virus (HSV) type 1 or type 2.  These viruses are most often spread through sexual contact with an infected person.  You are more likely to develop this condition if you have sex with many partners or you have unprotected sex.  Most people do not have symptoms (asymptomatic) or have mild symptoms that may be mistaken for other skin problems. Symptoms occur as outbreaks that may happen months or years apart.  There is no cure for this condition, but treatment with oral antiviral medicines can reduce symptoms, reduce the chance of spreading the virus to a partner, prevent future outbreaks, or shorten future outbreaks. This information is not intended to replace advice given to you by your  health care provider. Make sure you discuss any questions you have with your health care provider. Document Revised: 04/08/2018 Document Reviewed: 09/01/2016 Elsevier Patient Education  2020 [employer].

## 2020-05-20 NOTE — Progress Notes (Signed)
Acute Office Visit  Subjective:    Patient ID: Carrie Spears, female    DOB: 05-13-2001, 19 y.o.   MRN: [id number]  Chief Complaint  Patient presents with  . Rash    HPI Patient is in today for a rash on her back.  RASH Duration:  days  Location: left lower back/posterior hip  Itching: yes only at onset Burning: no Redness: yes Oozing: yes Scaling: no Blisters: yes Painful: no Fevers: no Change in detergents/soaps/personal care products: no Recent illness: yes Recent travel:no History of same: no Context: better Alleviating factors: neosporin Treatments attempted:neosporin Shortness of breath: no  Throat/tongue swelling: no Myalgias/arthralgias: no  She is tearful and concerned of HSV2 infection based on Internet research. She does not have any known exposure to HSV2.  She has never had chicken pox and was vaccinated as a child. She has not been around anyone with chicken pox that she is aware of.  She does have very sensitive skin and reacts to metals with an allergic fashion including irritation, erythema, and blisters.   Past Medical History:  Diagnosis Date  . ADHD (attention deficit hyperactivity disorder)     History reviewed. No pertinent surgical history.  Family History  Problem Relation Age of Onset  . Hypertension Mother   . Lupus Maternal Aunt   . Diabetes Maternal Aunt     Social History   Socioeconomic History  . Marital status: Single    Spouse name: Not on file  . Number of children: Not on file  . Years of education: Not on file  . Highest education level: Not on file  Occupational History  . Not on file  Tobacco Use  . Smoking status: Never Smoker  . Smokeless tobacco: Never Used  Substance and Sexual Activity  . Alcohol use: No    Alcohol/week: 0.0 standard drinks  . Drug use: No  . Sexual activity: Never  Other Topics Concern  . Not on file  Social History Narrative  . Not on file   Social Determinants of Health    Financial Resource Strain:   . Difficulty of Paying Living Expenses:   Food Insecurity:   . Worried About Programme researcher, broadcasting/film/video in the Last Year:   . Barista in the Last Year:   Transportation Needs:   . Freight forwarder (Medical):   Marland Kitchen Lack of Transportation (Non-Medical):   Physical Activity:   . Days of Exercise per Week:   . Minutes of Exercise per Session:   Stress:   . Feeling of Stress :   Social Connections:   . Frequency of Communication with Friends and Family:   . Frequency of Social Gatherings with Friends and Family:   . Attends Religious Services:   . Active Member of Clubs or Organizations:   . Attends Banker Meetings:   Marland Kitchen Marital Status:   Intimate Partner Violence:   . Fear of Current or Ex-Partner:   . Emotionally Abused:   Marland Kitchen Physically Abused:   . Sexually Abused:     Outpatient Medications Prior to Visit  Medication Sig Dispense Refill  . etonogestrel-ethinyl estradiol (NUVARING) 0.12-0.015 MG/24HR vaginal ring INSERT VAGINALLY AND LEAVE IN PLACE FOR 3 CONSECUTIVE WEEKS, THEN REMOVE FOR 1 WEEK 3 each 3  . benzonatate (TESSALON) 100 MG capsule Take 1 capsule (100 mg total) by mouth 3 (three) times daily as needed for cough. (Patient not taking: Reported on 05/20/2020) 30 capsule 0  . diphenoxylate-atropine (LOMOTIL) 2.5-0.025  MG tablet One to 2 tablets by mouth 4 times a day as needed for diarrhea. (Patient not taking: Reported on 04/22/2020) 30 tablet 0  . doxycycline (VIBRA-TABS) 100 MG tablet Take 1 tablet (100 mg total) by mouth 2 (two) times daily. (Patient not taking: Reported on 05/20/2020) 14 tablet 0  . metoCLOPramide (REGLAN) 10 MG tablet Take 0.5 tablets (5 mg total) by mouth every 8 (eight) hours as needed for nausea or vomiting (nausea/reflux). (Patient not taking: Reported on 04/22/2020) 30 tablet 0  . omeprazole (PRILOSEC) 40 MG capsule Take 1 capsule (40 mg total) by mouth daily. (Patient not taking: Reported on 04/22/2020) 30  capsule 3  . ondansetron (ZOFRAN-ODT) 8 MG disintegrating tablet Take 1 tablet (8 mg total) by mouth every 8 (eight) hours as needed for nausea. (Patient not taking: Reported on 04/22/2020) 20 tablet 1   No facility-administered medications prior to visit.    No Known Allergies     Objective:    Physical Exam Vitals and nursing note reviewed.  Constitutional:      Appearance: Normal appearance.  HENT:     Head: Normocephalic.  Eyes:     Extraocular Movements: Extraocular movements intact.     Conjunctiva/sclera: Conjunctivae normal.     Pupils: Pupils are equal, round, and reactive to light.  Cardiovascular:     Rate and Rhythm: Normal rate.     Pulses: Normal pulses.  Pulmonary:     Effort: Pulmonary effort is normal.  Abdominal:     Palpations: Abdomen is soft.  Musculoskeletal:        General: Normal range of motion.     Cervical back: Normal range of motion.  Skin:    General: Skin is warm and dry.     Capillary Refill: Capillary refill takes less than 2 seconds.     Findings: Rash present.       Neurological:     General: No focal deficit present.     Mental Status: She is alert and oriented to person, place, and time.  Psychiatric:        Mood and Affect: Mood normal.        Behavior: Behavior normal.        Thought Content: Thought content normal.        Judgment: Judgment normal.       Ht 5\' 6"  (1.676 m)   Wt 263 lb 11.2 oz (119.6 kg)   LMP 04/20/2020   BMI 42.56 kg/m  Wt Readings from Last 3 Encounters:  05/20/20 263 lb 11.2 oz (119.6 kg)  03/12/20 270 lb 12.8 oz (122.8 kg) (>99 %, Z= 2.68)*  09/15/19 281 lb (127.5 kg) (>99 %, Z= 2.71)*   * Growth percentiles are based on CDC (Girls, 2-20 Years) data.    Health Maintenance Due  Topic Date Due  . FOOT EXAM  Never done  . OPHTHALMOLOGY EXAM  Never done  . COVID-19 Vaccine (1) Never done  . INFLUENZA VACCINE  05/16/2020    There are no preventive care reminders to display for this  patient.   Lab Results  Component Value Date   TSH 1.18 07/08/2018   Lab Results  Component Value Date   WBC 14.7 (H) 02/02/2020   HGB 12.2 02/02/2020   HCT 37.9 02/02/2020   MCV 85 02/02/2020   PLT 450 02/02/2020   Lab Results  Component Value Date   NA 140 02/02/2020   K 4.3 02/02/2020   CO2 16 (L) 02/02/2020  GLUCOSE 96 02/02/2020   BUN 7 02/02/2020   CREATININE 0.68 02/02/2020   BILITOT <0.2 02/02/2020   ALKPHOS 78 02/02/2020   AST 22 02/02/2020   ALT 28 02/02/2020   PROT 7.9 02/02/2020   ALBUMIN 4.4 02/02/2020   CALCIUM 9.8 02/02/2020   ANIONGAP 9 07/06/2018   No results found for: CHOL No results found for: HDL No results found for: LDLCALC No results found for: TRIG No results found for: CHOLHDL Lab Results  Component Value Date   HGBA1C 5.7 (H) 02/02/2020       Assessment & Plan:   1. Dermatitis Cluster of vessicular lesions on an erythematous base with scaling around the edges in a linear position along the left flank/hip area. Total area of involvement 4cm widex 3 cm tall.  Lesion appears to be consistent with herpes zoster infection, however, given that the patient has never had chicken pox, this is unlikely.  There is concern for HSV2, however, the location of the lesions are not consistent with a typical HSV2 outbreak and she denies any lesions in the genital area, the thighs, the buttocks, or the mouth.  We did determine that she frequently wears a belt with metal clips that rubs that area of the skin surface repeatedly. It is likely that this could be a contact dermatitis rash from exposure to metal on her belt.  Given the information presented, we will test for HSV infection today and treat for contact dermatitis.  She is very tearful of the thought of HSV infection, but she was reassured that this infection can be dormant for many years and may not be visible when it is passed from a partner.  We will await results of testing to determine if  further action is needed, including suppressive therapy.  - Herpes simplex virus culture - triamcinolone cream (KENALOG) 0.1 %; Apply 1 application topically 3 (three) times daily. To affected area(s) as needed  Dispense: 45 g; Refill: 1 - mupirocin ointment (BACTROBAN) 2 %; Apply to affected area TID for 7 days.  Dispense: 30 g; Refill: 3  Follow-up with test results or if symptoms worsen or fail to improve.   Tollie Eth, NP

## 2020-05-20 NOTE — Addendum Note (Signed)
Addended by: Jermal Dismuke, Huntley Dec E on: 05/20/2020 02:53 PM   Modules accepted: Orders

## 2020-05-24 LAB — SURESWAB HSV, TYPE 1/2 DNA, PCR
HSV 1 DNA: NOT DETECTED
HSV 2 DNA: DETECTED — AB

## 2020-05-25 ENCOUNTER — Encounter: Payer: Self-pay | Admitting: Nurse Practitioner

## 2020-05-25 ENCOUNTER — Other Ambulatory Visit: Payer: Self-pay | Admitting: Nurse Practitioner

## 2020-05-25 DIAGNOSIS — B009 Herpesviral infection, unspecified: Secondary | ICD-10-CM

## 2020-05-25 MED ORDER — VALACYCLOVIR HCL 500 MG PO TABS: 500.0000 mg | ORAL_TABLET | Freq: Every day | ORAL | 11 refills | Status: DC

## 2020-05-25 NOTE — Progress Notes (Signed)
Please call patient. 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  Has the rash cleared up?  We have a couple options for medication. I can send in something that you take daily for suppression or we can send in something that you only take if you have an outbreak. What do you prefer?

## 2020-06-18 ENCOUNTER — Other Ambulatory Visit: Payer: Self-pay | Admitting: Physician Assistant

## 2020-06-23 ENCOUNTER — Encounter: Payer: Self-pay | Admitting: Physician Assistant

## 2020-07-16 ENCOUNTER — Encounter: Payer: Self-pay | Admitting: Physician Assistant

## 2020-07-16 MED ORDER — NAPROXEN 500 MG PO TABS: 500.0000 mg | ORAL_TABLET | Freq: Two times a day (BID) | ORAL | 2 refills | Status: DC

## 2020-07-16 MED ORDER — NAPROXEN 500 MG PO TABS
500.0000 mg | ORAL_TABLET | Freq: Two times a day (BID) | ORAL | 2 refills | Status: DC
Start: 2020-07-16 — End: 2020-07-16

## 2020-08-09 ENCOUNTER — Other Ambulatory Visit: Payer: Self-pay | Admitting: Physician Assistant

## 2020-08-16 ENCOUNTER — Encounter: Payer: Self-pay | Admitting: Physician Assistant

## 2020-08-18 ENCOUNTER — Telehealth (INDEPENDENT_AMBULATORY_CARE_PROVIDER_SITE_OTHER): Payer: BC Managed Care – PPO | Admitting: Physician Assistant

## 2020-08-18 VITALS — BP 141/85 | HR 74 | Ht 66.0 in | Wt 273.0 lb

## 2020-08-18 DIAGNOSIS — F419 Anxiety disorder, unspecified: Secondary | ICD-10-CM

## 2020-08-18 DIAGNOSIS — F902 Attention-deficit hyperactivity disorder, combined type: Secondary | ICD-10-CM

## 2020-08-18 DIAGNOSIS — F339 Major depressive disorder, recurrent, unspecified: Secondary | ICD-10-CM | POA: Insufficient documentation

## 2020-08-18 MED ORDER — SERTRALINE HCL 25 MG PO TABS: 25.0000 mg | ORAL_TABLET | Freq: Every day | ORAL | 1 refills | Status: DC

## 2020-08-18 NOTE — Progress Notes (Signed)
Patient ID: Carrie Spears, female   DOB: 01-20-2001, 19 y.o.   MRN: [id number] .Marland KitchenVirtual Visit via Video Note  I connected with [name] on 08/18/2020 at  8:50 AM EDT by a video enabled telemedicine application and verified that I am speaking with the correct person using two identifiers.  Location: Patient: home Provider: clinic   I discussed the limitations of evaluation and management by telemedicine and the availability of in person appointments. The patient expressed understanding and agreed to proceed.  History of Present Illness: Pt is a 19 yo female who calls into the clinic with depression and anxiety. Hx of ADHD currently not on any medication. She has had a difficult month but if she is honest her mood has not been good for over a year. She has struggled with feeling down and anxious. Right now she has no motivation to do anything. She is not in counseling and she has never been on medication. No SI/HC. She is struggling to go to work. Her home life is stressfull. She would like to be on medication.   .. Active Ambulatory Problems    Diagnosis Date Noted   ADHD (attention deficit hyperactivity disorder), combined type 09/10/2013   Prediabetes 12/28/2014   Mood swings 12/28/2014   Morbid obesity (HCC) 02/02/2016   ADD (attention deficit disorder) 02/02/2016   Other fatigue 02/02/2016   Thyromegaly 02/02/2016   Dysuria 02/02/2016   Vitamin D deficiency 02/08/2016   Elevated WBC count 02/08/2016   Hypothyroidism 02/08/2016   Reactive airway disease 12/15/2016   Neck pain 12/09/2017   DUB (dysfunctional uterine bleeding) 07/08/2018   Palpitations 07/08/2018   Iron deficiency anemia 07/08/2018   Low energy 01/14/2019   Cervicitis and endocervicitis 08/08/2019   Adnexal tenderness 08/08/2019   Epigastric abdominal pain 01/26/2020   Nausea and vomiting 01/26/2020   Diarrhea 01/26/2020   Pregestational diabetes mellitus, modified White class F  (HCC) 01/26/2020   Viral URI 05/10/2020   Depression, recurrent (HCC) 08/18/2020   Resolved Ambulatory Problems    Diagnosis Date Noted   Neck tightness 12/09/2017   Past Medical History:  Diagnosis Date   ADHD (attention deficit hyperactivity disorder)    Reviewed med, allergy, problem list.     Observations/Objective: No acute distress Tearful No labored breathing.   .. Today's Vitals   08/18/20 0843  BP: (!) 141/85  Pulse: 74  Weight: 273 lb (123.8 kg)  Height: 5\' 6"  (1.676 m)   Body mass index is 44.06 kg/m.   .. Depression screen [facility] 2/9 08/18/2020 12/07/2017  Decreased Interest 1 0  Down, Depressed, Hopeless 3 0  PHQ - 2 Score 4 0  Altered sleeping 3 1  Tired, decreased energy 1 1  Change in appetite 1 0  Feeling bad or failure about yourself  1 0  Trouble concentrating 1 1  Moving slowly or fidgety/restless 3 0  Suicidal thoughts 1 0  PHQ-9 Score 15 3  Difficult doing work/chores Extremely dIfficult Not difficult at all   .12/09/2017 GAD 7 : Generalized Anxiety Score 08/18/2020  Nervous, Anxious, on Edge 3  Control/stop worrying 1  Worry too much - different things 1  Trouble relaxing 3  Restless 1  Easily annoyed or irritable 3  Afraid - awful might happen 1  Total GAD 7 Score 13  Anxiety Difficulty Somewhat difficult     Assessment and Plan: 13/3/2021Marland KitchenLachrisha was seen today for anxiety.  Diagnoses and all orders for this visit:  Depression, recurrent (HCC) -  sertraline (ZOLOFT) 25 MG tablet; Take 1 tablet (25 mg total) by mouth daily.  Anxiety -     sertraline (ZOLOFT) 25 MG tablet; Take 1 tablet (25 mg total) by mouth daily.  ADHD (attention deficit hyperactivity disorder), combined type   Hold on ADHD medication. Start zoloft. Discussed side effects. Follow up in 4-6 weeks. Consider counseling. May have to add and make adjustments along the way. Try to walk or get some form of exercise a day.    Follow Up Instructions:    I discussed  the assessment and treatment plan with the patient. The patient was provided an opportunity to ask questions and all were answered. The patient agreed with the plan and demonstrated an understanding of the instructions.   The patient was advised to call back or seek an in-person evaluation if the symptoms worsen or if the condition fails to improve as anticipated.     Tandy Gaw, PA-C

## 2020-08-18 NOTE — Progress Notes (Signed)
Ready to get on medication for mood PHQ9 (15) -GAD7 (13) completed.   Has never been on medication for mood in the past only ADHD years ago

## 2020-08-20 ENCOUNTER — Encounter: Payer: Self-pay | Admitting: Physician Assistant

## 2020-09-26 ENCOUNTER — Encounter: Payer: Self-pay | Admitting: Physician Assistant

## 2020-09-28 ENCOUNTER — Other Ambulatory Visit: Payer: Self-pay

## 2020-09-28 ENCOUNTER — Ambulatory Visit (INDEPENDENT_AMBULATORY_CARE_PROVIDER_SITE_OTHER): Payer: BC Managed Care – PPO | Admitting: Physician Assistant

## 2020-09-28 ENCOUNTER — Encounter: Payer: Self-pay | Admitting: Physician Assistant

## 2020-09-28 VITALS — BP 137/76 | HR 74 | Ht 66.0 in | Wt 281.0 lb

## 2020-09-28 DIAGNOSIS — M542 Cervicalgia: Secondary | ICD-10-CM

## 2020-09-28 DIAGNOSIS — F339 Major depressive disorder, recurrent, unspecified: Secondary | ICD-10-CM

## 2020-09-28 DIAGNOSIS — F902 Attention-deficit hyperactivity disorder, combined type: Secondary | ICD-10-CM

## 2020-09-28 DIAGNOSIS — R7303 Prediabetes: Secondary | ICD-10-CM | POA: Diagnosis not present

## 2020-09-28 DIAGNOSIS — Z8639 Personal history of other endocrine, nutritional and metabolic disease: Secondary | ICD-10-CM | POA: Diagnosis not present

## 2020-09-28 DIAGNOSIS — Z23 Encounter for immunization: Secondary | ICD-10-CM | POA: Diagnosis not present

## 2020-09-28 DIAGNOSIS — R4586 Emotional lability: Secondary | ICD-10-CM

## 2020-09-28 DIAGNOSIS — E010 Iodine-deficiency related diffuse (endemic) goiter: Secondary | ICD-10-CM

## 2020-09-28 MED ORDER — SERTRALINE HCL 50 MG PO TABS: 50.0000 mg | ORAL_TABLET | Freq: Every day | ORAL | 2 refills | Status: DC

## 2020-09-28 NOTE — Progress Notes (Addendum)
Acute Office Visit  Subjective:    Patient ID: Carrie Spears, female    DOB: 09/08/2001, 19 y.o.   MRN: [id number]  Chief Complaint  Patient presents with  . Neck Pain    Patient is pleasant 19 yo F in NAD presenting to clinic today for neck pain. Pt reports neck has felt sore for about 1 week. 2-3 days ago patient wasn't able to turn her head to the left or right due to pain. Also 2-3 days ago patient had one episode of trouble swallowing which lasted most of the day and resolved spontaneously by the next morning. Pt felt one small "ball" on the right side of her neck just inferior and posterior to ear. She reports this mass is now gone.   Patient reports recent illness about two weeks ago. Patient describes illness as head cold with nasal congestion, fluid/fullness in ears. Patient took mucinex liquid with relief.   PMH of hypothyroidism, was taking levothyroxine back in 2017. Last TSH was 1.18 in September 2019 and patient was instructed to stop levothyroxine. Patient is suspicious of thyroid problem due to similar tenderness.  Mood much better on zoloft. Would like to consider increase. Continues to be irritable at work.   Past Medical History:  Diagnosis Date  . ADHD (attention deficit hyperactivity disorder)     No past surgical history on file.  Family History  Problem Relation Age of Onset  . Hypertension Mother   . Lupus Maternal Aunt   . Diabetes Maternal Aunt     Outpatient Medications Prior to Visit  Medication Sig Dispense Refill  . etonogestrel-ethinyl estradiol (NUVARING) 0.12-0.015 MG/24HR vaginal ring INSERT VAGINALLY AND LEAVE IN PLACE FOR 3 CONSECUTIVE WEEKS, THEN REMOVE FOR 1 WEEK 3 each 3  . naproxen (NAPROSYN) 500 MG tablet Take 1 tablet (500 mg total) by mouth 2 (two) times daily with a meal. 60 tablet 2  . sertraline (ZOLOFT) 25 MG tablet Take 1 tablet (25 mg total) by mouth daily. 30 tablet 1  . triamcinolone cream (KENALOG) 0.1 % Apply 1 application  topically 3 (three) times daily. To affected area(s) as needed 45 g 1  . valACYclovir (VALTREX) 500 MG tablet Take 1 tablet (500 mg total) by mouth daily. 30 tablet 11   No facility-administered medications prior to visit.    No Known Allergies  Review of Systems  Constitutional: Positive for fatigue. Negative for fever.  HENT: Positive for trouble swallowing. Negative for congestion, ear pain, sinus pain and sore throat.   Respiratory: Negative for cough, chest tightness and shortness of breath.   Musculoskeletal: Positive for neck pain.      Objective:    Physical Exam Constitutional:      General: She is not in acute distress.    Appearance: Normal appearance.  HENT:     Nose: No congestion.     Right Turbinates: Not enlarged.     Left Turbinates: Not enlarged.     Right Sinus: No maxillary sinus tenderness or frontal sinus tenderness.     Left Sinus: No maxillary sinus tenderness or frontal sinus tenderness.     Mouth/Throat:     Pharynx: No pharyngeal swelling, oropharyngeal exudate or posterior oropharyngeal erythema.     Tonsils: No tonsillar exudate.  Neck:     Thyroid: Thyroid tenderness present.  Musculoskeletal:     Cervical back: Muscular tenderness present. No pain with movement. Normal range of motion.  Lymphadenopathy:     Cervical: No cervical adenopathy.  Psychiatric:          Behavior: Behavior is cooperative.     BP 137/76   Pulse 74   Ht 5' 6" (1.676 m)   Wt 281 lb (127.5 kg)   SpO2 98%   BMI 45.35 kg/m  Wt Readings from Last 3 Encounters:  09/28/20 281 lb (127.5 kg)  08/18/20 273 lb (123.8 kg)  05/20/20 263 lb 11.2 oz (119.6 kg)       Assessment & Plan:  ..Carrie Spears was seen today for neck pain.  Diagnoses and all orders for this visit:  Thyromegaly -     Cancel: TSH -     Thyroid Peroxidase Antibodies (TPO) (REFL) -     COMPLETE METABOLIC PANEL WITH GFR -     TSH -     Thyroid peroxidase antibody  Flu vaccine need -     Flu  Vaccine QUAD 36+ mos IM  Pre-diabetes -     Cancel: Hemoglobin A1c -     COMPLETE METABOLIC PANEL WITH GFR -     Hemoglobin A1c -     CMP14+EGFR  History of hypothyroidism -     Cancel: TSH -     Thyroid Peroxidase Antibodies (TPO) (REFL) -     COMPLETE METABOLIC PANEL WITH GFR -     TSH -     Thyroid peroxidase antibody  ADHD (attention deficit hyperactivity disorder), combined type  Depression, recurrent (HCC) -     sertraline (ZOLOFT) 50 MG tablet; Take 1 tablet (50 mg total) by mouth daily.  Mood swings -     sertraline (ZOLOFT) 50 MG tablet; Take 1 tablet (50 mg total) by mouth daily.   Will check TSH and thyroid antibodies. Hx of thyromegaly. US back in 2018 with a benign cyst. If TSH out of range will treat. If normal consider follow up thyroid u/s. She just had URI about 2 weeks ago. Her thyroid could be inflamed.   Increased zoloft to 50mg daily. Follow up in 3 months.   ..I, Jade Breeback PA-C, have reviewed and agree with the above documentation in it's entirety.   

## 2020-10-10 ENCOUNTER — Encounter: Payer: Self-pay | Admitting: Physician Assistant

## 2020-12-27 ENCOUNTER — Ambulatory Visit: Payer: BC Managed Care – PPO | Admitting: Physician Assistant

## 2021-01-17 ENCOUNTER — Other Ambulatory Visit: Payer: Self-pay | Admitting: Physician Assistant

## 2021-01-17 DIAGNOSIS — Z3044 Encounter for surveillance of vaginal ring hormonal contraceptive device: Secondary | ICD-10-CM

## 2021-01-17 DIAGNOSIS — N938 Other specified abnormal uterine and vaginal bleeding: Secondary | ICD-10-CM

## 2021-01-24 ENCOUNTER — Encounter: Payer: Self-pay | Admitting: Physician Assistant

## 2021-02-01 ENCOUNTER — Encounter: Payer: Self-pay | Admitting: Physician Assistant

## 2021-03-06 ENCOUNTER — Other Ambulatory Visit: Payer: Self-pay | Admitting: Physician Assistant

## 2021-03-06 DIAGNOSIS — F419 Anxiety disorder, unspecified: Secondary | ICD-10-CM

## 2021-03-06 DIAGNOSIS — F339 Major depressive disorder, recurrent, unspecified: Secondary | ICD-10-CM

## 2021-03-07 ENCOUNTER — Other Ambulatory Visit: Payer: Self-pay | Admitting: Physician Assistant

## 2021-03-07 DIAGNOSIS — F419 Anxiety disorder, unspecified: Secondary | ICD-10-CM

## 2021-03-07 DIAGNOSIS — F339 Major depressive disorder, recurrent, unspecified: Secondary | ICD-10-CM

## 2021-04-11 ENCOUNTER — Other Ambulatory Visit: Payer: Self-pay | Admitting: Physician Assistant

## 2021-04-11 DIAGNOSIS — N938 Other specified abnormal uterine and vaginal bleeding: Secondary | ICD-10-CM

## 2021-04-11 DIAGNOSIS — Z3044 Encounter for surveillance of vaginal ring hormonal contraceptive device: Secondary | ICD-10-CM

## 2021-04-26 ENCOUNTER — Other Ambulatory Visit: Payer: Self-pay | Admitting: Physician Assistant

## 2021-04-26 DIAGNOSIS — R4586 Emotional lability: Secondary | ICD-10-CM

## 2021-04-26 DIAGNOSIS — F339 Major depressive disorder, recurrent, unspecified: Secondary | ICD-10-CM

## 2021-05-31 ENCOUNTER — Encounter: Payer: Self-pay | Admitting: Physician Assistant

## 2021-06-01 ENCOUNTER — Encounter: Payer: Self-pay | Admitting: Physician Assistant

## 2021-06-01 ENCOUNTER — Telehealth (INDEPENDENT_AMBULATORY_CARE_PROVIDER_SITE_OTHER): Payer: BC Managed Care – PPO | Admitting: Physician Assistant

## 2021-06-01 DIAGNOSIS — U071 COVID-19: Secondary | ICD-10-CM | POA: Diagnosis not present

## 2021-06-01 NOTE — Progress Notes (Signed)
..  Virtual Visit via Video Note  I connected with Carrie Spears on 06/01/21 at  3:00 PM EDT by a video enabled telemedicine application and verified that I am speaking with the correct person using two identifiers.  Location: Patient: home Provider: clinic  .Marland KitchenParticipating in visit:  Patient: Carrie Spears Provider: Tandy Gaw PA-C    I discussed the limitations of evaluation and management by telemedicine and the availability of in person appointments. The patient expressed understanding and agreed to proceed.  History of Present Illness: Pt is a 20 yo obese female who calls into the clinic after testing positive for covid 2 days ago. She has been vaccinated with moderna 2 doses. Her mother and father both have covid. She is doing pretty well. She is feel better today. She has some body aches and cough. Not taking medication. No problems breathing. Denies any fever, chills, leg pain, swelling.   .. Active Ambulatory Problems    Diagnosis Date Noted   ADHD (attention deficit hyperactivity disorder), combined type 09/10/2013   Prediabetes 12/28/2014   Mood swings 12/28/2014   Morbid obesity (HCC) 02/02/2016   ADD (attention deficit disorder) 02/02/2016   Other fatigue 02/02/2016   Thyromegaly 02/02/2016   Dysuria 02/02/2016   Vitamin D deficiency 02/08/2016   Elevated WBC count 02/08/2016   Hypothyroidism 02/08/2016   Reactive airway disease 12/15/2016   Tenderness of neck 12/09/2017   DUB (dysfunctional uterine bleeding) 07/08/2018   Palpitations 07/08/2018   Iron deficiency anemia 07/08/2018   Low energy 01/14/2019   Cervicitis and endocervicitis 08/08/2019   Adnexal tenderness 08/08/2019   Epigastric abdominal pain 01/26/2020   Nausea and vomiting 01/26/2020   Diarrhea 01/26/2020   Pregestational diabetes mellitus, modified White class F (HCC) 01/26/2020   Depression, recurrent (HCC) 08/18/2020   Resolved Ambulatory Problems    Diagnosis Date Noted   Neck tightness  12/09/2017   Viral URI 05/10/2020   Past Medical History:  Diagnosis Date   ADHD (attention deficit hyperactivity disorder)       Observations/Objective: No acute distress Normal breathing. Dry cough  .Marland KitchenThere were no vitals filed for this visit. There is no height or weight on file to calculate BMI.    Assessment and Plan: Marland KitchenMarland KitchenTakenya was seen today for covid positive.  Diagnoses and all orders for this visit:  COVID-19 virus infection  Pt is a obese and pre-diabetic that does make her higher risk but she is improving and doing better.  Decided against antiviral.  Continue with symptomatic care and vitamins.  Marland Kitchen.Vitamin D3 5000 IU (125 mcg) daily Vitamin C 500 mg twice daily Zinc 50 to 75 mg daily Quarantine until 20th of august. If feeling better ok to go back to work just wear a mask for another 5 days.  Discussed warning signs and red flag symptoms or worsening to go to ED/UC.    Follow Up Instructions:    I discussed the assessment and treatment plan with the patient. The patient was provided an opportunity to ask questions and all were answered. The patient agreed with the plan and demonstrated an understanding of the instructions.   The patient was advised to call back or seek an in-person evaluation if the symptoms worsen or if the condition fails to improve as anticipated.     Tandy Gaw, PA-C

## 2021-06-02 ENCOUNTER — Telehealth: Payer: BC Managed Care – PPO | Admitting: Family Medicine

## 2021-06-03 ENCOUNTER — Encounter: Payer: Self-pay | Admitting: Physician Assistant

## 2021-06-16 ENCOUNTER — Other Ambulatory Visit: Payer: Self-pay | Admitting: Nurse Practitioner

## 2021-06-16 DIAGNOSIS — B009 Herpesviral infection, unspecified: Secondary | ICD-10-CM

## 2021-06-16 DIAGNOSIS — L309 Dermatitis, unspecified: Secondary | ICD-10-CM

## 2021-07-04 ENCOUNTER — Other Ambulatory Visit: Payer: Self-pay | Admitting: Physician Assistant

## 2021-07-04 DIAGNOSIS — Z3044 Encounter for surveillance of vaginal ring hormonal contraceptive device: Secondary | ICD-10-CM

## 2021-07-04 DIAGNOSIS — N938 Other specified abnormal uterine and vaginal bleeding: Secondary | ICD-10-CM

## 2021-08-15 ENCOUNTER — Encounter: Payer: Self-pay | Admitting: Physician Assistant

## 2021-08-15 DIAGNOSIS — R4586 Emotional lability: Secondary | ICD-10-CM

## 2021-08-15 DIAGNOSIS — F339 Major depressive disorder, recurrent, unspecified: Secondary | ICD-10-CM

## 2021-08-18 ENCOUNTER — Encounter: Payer: Self-pay | Admitting: Physician Assistant

## 2021-08-18 MED ORDER — NAPROXEN 500 MG PO TABS: 500.0000 mg | ORAL_TABLET | Freq: Two times a day (BID) | ORAL | 2 refills | Status: DC

## 2021-08-24 ENCOUNTER — Ambulatory Visit: Payer: BC Managed Care – PPO | Admitting: Physician Assistant

## 2021-08-24 VITALS — BP 119/69 | HR 80 | Ht 66.0 in | Wt 297.0 lb

## 2021-08-24 DIAGNOSIS — B009 Herpesviral infection, unspecified: Secondary | ICD-10-CM

## 2021-08-24 DIAGNOSIS — L309 Dermatitis, unspecified: Secondary | ICD-10-CM

## 2021-08-24 DIAGNOSIS — Z23 Encounter for immunization: Secondary | ICD-10-CM

## 2021-08-24 DIAGNOSIS — R4586 Emotional lability: Secondary | ICD-10-CM

## 2021-08-24 DIAGNOSIS — F339 Major depressive disorder, recurrent, unspecified: Secondary | ICD-10-CM

## 2021-08-24 MED ORDER — SERTRALINE HCL 100 MG PO TABS: 100.0000 mg | ORAL_TABLET | Freq: Every day | ORAL | 0 refills | Status: DC

## 2021-08-24 MED ORDER — VALACYCLOVIR HCL 500 MG PO TABS: 500.0000 mg | ORAL_TABLET | Freq: Every day | ORAL | 3 refills | Status: DC

## 2021-08-25 ENCOUNTER — Encounter: Payer: Self-pay | Admitting: Physician Assistant

## 2021-08-25 NOTE — Progress Notes (Signed)
Subjective:    Patient ID: Carrie Spears, female    DOB: [birth year], 20 y.o.   MRN: [id number]  HPI Pt is a 20 yo female with mood swings and depression who presents to the clinic for medication follow up.   She initially had a great response to zoloft for many months but lately it has seem to not be as effective. She is more down and not motivated. She finds herself worrying about different things. She has struggled to get a part time job while in school and that has frustrated her. She feels like she is "sitting around" too much. No SI/HC.   No outbreaks needs refills of valtrex for suppression.     .. Active Ambulatory Problems    Diagnosis Date Noted   ADHD (attention deficit hyperactivity disorder), combined type 09/10/2013   Prediabetes 12/28/2014   Mood swings 12/28/2014   Morbid obesity (HCC) 02/02/2016   ADD (attention deficit disorder) 02/02/2016   Other fatigue 02/02/2016   Thyromegaly 02/02/2016   Dysuria 02/02/2016   Vitamin D deficiency 02/08/2016   Elevated WBC count 02/08/2016   Hypothyroidism 02/08/2016   Reactive airway disease 12/15/2016   Tenderness of neck 12/09/2017   DUB (dysfunctional uterine bleeding) 07/08/2018   Palpitations 07/08/2018   Iron deficiency anemia 07/08/2018   Low energy 01/14/2019   Cervicitis and endocervicitis 08/08/2019   Adnexal tenderness 08/08/2019   Epigastric abdominal pain 01/26/2020   Nausea and vomiting 01/26/2020   Diarrhea 01/26/2020   Pregestational diabetes mellitus, modified White class F (HCC) 01/26/2020   Depression, recurrent (HCC) 08/18/2020   Resolved Ambulatory Problems    Diagnosis Date Noted   Neck tightness 12/09/2017   Viral URI 05/10/2020   Past Medical History:  Diagnosis Date   ADHD (attention deficit hyperactivity disorder)     Review of Systems See HPI.     Objective:   Physical Exam Vitals reviewed.  Constitutional:      Appearance: Normal appearance. She is obese.  HENT:     Head:  Normocephalic.  Cardiovascular:     Rate and Rhythm: Normal rate.  Pulmonary:     Effort: Pulmonary effort is normal.  Neurological:     General: No focal deficit present.     Mental Status: She is alert and oriented to person, place, and time.         .. Depression screen [facility] 2/9 08/24/2021 08/18/2020 12/07/2017  Decreased Interest 2 1 0  Down, Depressed, Hopeless 2 3 0  PHQ - 2 Score 4 4 0  Altered sleeping 1 3 1   Tired, decreased energy 1 1 1   Change in appetite 0 1 0  Feeling bad or failure about yourself  3 1 0  Trouble concentrating 0 1 1  Moving slowly or fidgety/restless 0 3 0  Suicidal thoughts 1 1 0  PHQ-9 Score 10 15 3   Difficult doing work/chores Somewhat difficult Extremely dIfficult Not difficult at all   . GAD 7 : Generalized Anxiety Score 08/24/2021 08/18/2020  Nervous, Anxious, on Edge 1 3  Control/stop worrying 1 1  Worry too much - different things 1 1  Trouble relaxing 0 3  Restless 0 1  Easily annoyed or irritable 3 3  Afraid - awful might happen 3 1  Total GAD 7 Score 9 13  Anxiety Difficulty Somewhat difficult Somewhat difficult     Assessment & Plan:  Marland Kitchen13/9/2022[name] was seen today for follow-up.  Diagnoses and all orders for this visit:  Depression, recurrent (HCC) -  sertraline (ZOLOFT) 100 MG tablet; Take 1 tablet (100 mg total) by mouth daily.  Routine cultures positive for HSV2 -     valACYclovir (VALTREX) 500 MG tablet; Take 1 tablet (500 mg total) by mouth daily.  Flu vaccine need -     Flu Vaccine QUAD 60mo+IM (Fluarix, Fluzone & Alfiuria Quad PF)  Mood swings -     sertraline (ZOLOFT) 100 MG tablet; Take 1 tablet (100 mg total) by mouth daily.  PHQ and GAD not to goal but better than 1 year ago.  Increased zoloft to 100mg .  Follow up in 6 weeks.  Discussed hobbies and regular exercise.  Refilled valtrex.  Flu shot given today.

## 2021-10-24 ENCOUNTER — Encounter: Payer: Self-pay | Admitting: Physician Assistant

## 2021-11-25 ENCOUNTER — Ambulatory Visit (INDEPENDENT_AMBULATORY_CARE_PROVIDER_SITE_OTHER): Payer: Self-pay | Admitting: Physician Assistant

## 2021-11-25 DIAGNOSIS — E010 Iodine-deficiency related diffuse (endemic) goiter: Secondary | ICD-10-CM

## 2021-11-25 DIAGNOSIS — Z79899 Other long term (current) drug therapy: Secondary | ICD-10-CM

## 2021-11-25 DIAGNOSIS — E039 Hypothyroidism, unspecified: Secondary | ICD-10-CM

## 2021-11-25 DIAGNOSIS — R7303 Prediabetes: Secondary | ICD-10-CM

## 2021-11-25 DIAGNOSIS — Z91199 Patient's noncompliance with other medical treatment and regimen due to unspecified reason: Secondary | ICD-10-CM

## 2021-11-25 NOTE — Progress Notes (Signed)
No show

## 2022-01-09 ENCOUNTER — Encounter: Payer: Self-pay | Admitting: Physician Assistant

## 2022-01-10 ENCOUNTER — Telehealth: Payer: BC Managed Care – PPO | Admitting: Physician Assistant

## 2022-01-10 DIAGNOSIS — J0110 Acute frontal sinusitis, unspecified: Secondary | ICD-10-CM

## 2022-01-10 MED ORDER — AMOXICILLIN-POT CLAVULANATE 875-125 MG PO TABS: 1.0000 | ORAL_TABLET | Freq: Two times a day (BID) | ORAL | 0 refills | Status: DC

## 2022-01-10 NOTE — Progress Notes (Signed)
?Virtual Visit Consent  ? ?Carrie Spears, you are scheduled for a virtual visit with a [facility] provider today.   ?  ?Just as with appointments in the office, your consent must be obtained to participate.  Your consent will be active for this visit and any virtual visit you may have with one of our providers in the next 365 days.   ?  ?If you have a MyChart account, a copy of this consent can be sent to you electronically.  All virtual visits are billed to your insurance company just like a traditional visit in the office.   ? ?As this is a virtual visit, video technology does not allow for your provider to perform a traditional examination.  This may limit your provider's ability to fully assess your condition.  If your provider identifies any concerns that need to be evaluated in person or the need to arrange testing (such as labs, EKG, etc.), we will make arrangements to do so.   ?  ?Although advances in technology are sophisticated, we cannot ensure that it will always work on either your end or our end.  If the connection with a video visit is poor, the visit may have to be switched to a telephone visit.  With either a video or telephone visit, we are not always able to ensure that we have a secure connection.    ? ?I need to obtain your verbal consent now.   Are you willing to proceed with your visit today?  ?  ?Carrie Spears has provided verbal consent on 01/10/2022 for a virtual visit (video or telephone). ?  ?Carrie Climes, PA-C  ? ?Date: 01/10/2022 11:37 AM ? ? ?Virtual Visit via Video Note  ? ?IPiedad Spears, connected with  Carrie Spears  ([id number], [birth year]) on 01/10/22 at 11:00 AM EDT by a video-enabled telemedicine application and verified that I am speaking with the correct person using two identifiers. ? ?Location: ?Patient: Virtual Visit Location Patient: Home ?Provider: Virtual Visit Location Provider: Home Office ?  ?I discussed the limitations of evaluation and management by  telemedicine and the availability of in person appointments. The patient expressed understanding and agreed to proceed.   ? ?History of Present Illness: ?Carrie Spears is a 21 y.o. who identifies as a female who was assigned female at birth, and is being seen today for possible sinusitis. Endorses history of seasonal allergies has been dealing with . Over past 48 hours has noted more substantial fatigue, sinus pressure, sinus pain. Denies fever, chills, aches. Denies recent travel. Mom with similar symptoms recently but got better. Has taken a home COVID test which negative.  ? ?HPI: HPI  ?Problems:  ?Patient Active Problem List  ? Diagnosis Date Noted  ? Depression, recurrent (HCC) 08/18/2020  ? Epigastric abdominal pain 01/26/2020  ? Nausea and vomiting 01/26/2020  ? Diarrhea 01/26/2020  ? Pregestational diabetes mellitus, modified White class F (HCC) 01/26/2020  ? Cervicitis and endocervicitis 08/08/2019  ? Adnexal tenderness 08/08/2019  ? Low energy 01/14/2019  ? DUB (dysfunctional uterine bleeding) 07/08/2018  ? Palpitations 07/08/2018  ? Iron deficiency anemia 07/08/2018  ? Tenderness of neck 12/09/2017  ? Reactive airway disease 12/15/2016  ? Vitamin D deficiency 02/08/2016  ? Elevated WBC count 02/08/2016  ? Hypothyroidism 02/08/2016  ? Morbid obesity (HCC) 02/02/2016  ? ADD (attention deficit disorder) 02/02/2016  ? Other fatigue 02/02/2016  ? Thyromegaly 02/02/2016  ? Dysuria 02/02/2016  ? Prediabetes 12/28/2014  ? Mood swings 12/28/2014  ?  ADHD (attention deficit hyperactivity disorder), combined type 09/10/2013  ?  ?Allergies: No Known Allergies ?Medications:  ?Current Outpatient Medications:  ?  amoxicillin-clavulanate (AUGMENTIN) 875-125 MG tablet, Take 1 tablet by mouth 2 (two) times daily., Disp: 14 tablet, Rfl: 0 ?  etonogestrel-ethinyl estradiol (NUVARING) 0.12-0.015 MG/24HR vaginal ring, INSERT VAGINALLY AND LEAVE IN PLACE FOR 3 CONSECUTIVE WEEKS, THEN REMOVE FOR 1 WEEK (APPOINTMENT FOR REFILLS),  Disp: 3 each, Rfl: 0 ?  sertraline (ZOLOFT) 100 MG tablet, Take 1 tablet (100 mg total) by mouth daily., Disp: 90 tablet, Rfl: 0 ?  valACYclovir (VALTREX) 500 MG tablet, Take 1 tablet (500 mg total) by mouth daily., Disp: 90 tablet, Rfl: 3 ? ?Observations/Objective: ?Patient is well-developed, well-nourished in no acute distress.  ?Resting comfortably at home.  ?Head is normocephalic, atraumatic.  ?No labored breathing. ?Speech is clear and coherent with logical content.  ?Patient is alert and oriented at baseline.  ? ?Assessment and Plan: ?1. Acute non-recurrent frontal sinusitis ?- amoxicillin-clavulanate (AUGMENTIN) 875-125 MG tablet; Take 1 tablet by mouth 2 (two) times daily.  Dispense: 14 tablet; Refill: 0 ? ?Discussed likely viral so started her on supportive measures and reviewed OTC medications. She does have substantial history of sinusitis and typically does require antibiotic so put Augmentin on file to start if symptoms not easing up over next 48-72 hours or if anything new/worsening.  ? ?Follow Up Instructions: ?I discussed the assessment and treatment plan with the patient. The patient was provided an opportunity to ask questions and all were answered. The patient agreed with the plan and demonstrated an understanding of the instructions.  A copy of instructions were sent to the patient via MyChart unless otherwise noted below.  ? ?The patient was advised to call back or seek an in-person evaluation if the symptoms worsen or if the condition fails to improve as anticipated. ? ?Time:  ?I spent 10 minutes with the patient via telehealth technology discussing the above problems/concerns.   ? ?Carrie Climes, PA-C ?

## 2022-01-10 NOTE — Patient Instructions (Addendum)
Carrie BaldingPatricia Spears, thank you for joining Carrie ClimesWilliam Cody Mikhail Hallenbeck, PA-C for today's virtual visit.  While this provider is not your primary care provider (PCP), if your PCP is located in our provider database this encounter information will be shared with them immediately following your visit. ? ?Consent: ?(Patient) Carrie Baldingatricia Petruska provided verbal consent for this virtual visit at the beginning of the encounter. ? ?Current Medications: ? ?Current Outpatient Medications:  ?  amoxicillin-clavulanate (AUGMENTIN) 875-125 MG tablet, Take 1 tablet by mouth 2 (two) times daily., Disp: 14 tablet, Rfl: 0 ?  etonogestrel-ethinyl estradiol (NUVARING) 0.12-0.015 MG/24HR vaginal ring, INSERT VAGINALLY AND LEAVE IN PLACE FOR 3 CONSECUTIVE WEEKS, THEN REMOVE FOR 1 WEEK (APPOINTMENT FOR REFILLS), Disp: 3 each, Rfl: 0 ?  sertraline (ZOLOFT) 100 MG tablet, Take 1 tablet (100 mg total) by mouth daily., Disp: 90 tablet, Rfl: 0 ?  valACYclovir (VALTREX) 500 MG tablet, Take 1 tablet (500 mg total) by mouth daily., Disp: 90 tablet, Rfl: 3  ? ?Medications ordered in this encounter:  ?Meds ordered this encounter  ?Medications  ? amoxicillin-clavulanate (AUGMENTIN) 875-125 MG tablet  ?  Sig: Take 1 tablet by mouth 2 (two) times daily.  ?  Dispense:  14 tablet  ?  Refill:  0  ?  Order Specific Question:   Supervising Provider  ?  Answer:   Eber HongMILLER, BRIAN [[id number]]  ?  ? ?*If you need refills on other medications prior to your next appointment, please contact your pharmacy* ? ?Follow-Up: ?Call back or seek an in-person evaluation if the symptoms worsen or if the condition fails to improve as anticipated. ? ?Other Instructions ?Please take antibiotic as directed.  Increase fluid intake.  Use Saline nasal spray.  Take a daily multivitamin. Use OTC Flonase.   Place a humidifier in the bedroom.  Please call or return clinic if symptoms are not improving. ? ?Sinusitis ?Sinusitis is redness, soreness, and swelling (inflammation) of the paranasal sinuses.  Paranasal sinuses are air pockets within the bones of your face (beneath the eyes, the middle of the forehead, or above the eyes). In healthy paranasal sinuses, mucus is able to drain out, and air is able to circulate through them by way of your nose. However, when your paranasal sinuses are inflamed, mucus and air can become trapped. This can allow bacteria and other germs to grow and cause infection. ?Sinusitis can develop quickly and last only a short time (acute) or continue over a long period (chronic). Sinusitis that lasts for more than 12 weeks is considered chronic.  ?CAUSES  ?Causes of sinusitis include: ?Allergies. ?Structural abnormalities, such as displacement of the cartilage that separates your nostrils (deviated septum), which can decrease the air flow through your nose and sinuses and affect sinus drainage. ?Functional abnormalities, such as when the small hairs (cilia) that line your sinuses and help remove mucus do not work properly or are not present. ?SYMPTOMS  ?Symptoms of acute and chronic sinusitis are the same. The primary symptoms are pain and pressure around the affected sinuses. Other symptoms include: ?Upper toothache. ?Earache. ?Headache. ?Bad breath. ?Decreased sense of smell and taste. ?A cough, which worsens when you are lying flat. ?Fatigue. ?Fever. ?Thick drainage from your nose, which often is green and may contain pus (purulent). ?Swelling and warmth over the affected sinuses. ?DIAGNOSIS  ?Your caregiver will perform a physical exam. During the exam, your caregiver may: ?Look in your nose for signs of abnormal growths in your nostrils (nasal polyps). ?Tap over the affected sinus to check  for signs of infection. ?View the inside of your sinuses (endoscopy) with a special imaging device with a light attached (endoscope), which is inserted into your sinuses. ?If your caregiver suspects that you have chronic sinusitis, one or more of the following tests may be recommended: ?Allergy  tests. ?Nasal culture A sample of mucus is taken from your nose and sent to a lab and screened for bacteria. ?Nasal cytology A sample of mucus is taken from your nose and examined by your caregiver to determine if your sinusitis is related to an allergy. ?TREATMENT  ?Most cases of acute sinusitis are related to a viral infection and will resolve on their own within 10 days. Sometimes medicines are prescribed to help relieve symptoms (pain medicine, decongestants, nasal steroid sprays, or saline sprays).  ?However, for sinusitis related to a bacterial infection, your caregiver will prescribe antibiotic medicines. These are medicines that will help kill the bacteria causing the infection.  ?Rarely, sinusitis is caused by a fungal infection. In theses cases, your caregiver will prescribe antifungal medicine. ?For some cases of chronic sinusitis, surgery is needed. Generally, these are cases in which sinusitis recurs more than 3 times per year, despite other treatments. ?HOME CARE INSTRUCTIONS  ?Drink plenty of water. Water helps thin the mucus so your sinuses can drain more easily. ?Use a humidifier. ?Inhale steam 3 to 4 times a day (for example, sit in the bathroom with the shower running). ?Apply a warm, moist washcloth to your face 3 to 4 times a day, or as directed by your caregiver. ?Use saline nasal sprays to help moisten and clean your sinuses. ?Take over-the-counter or prescription medicines for pain, discomfort, or fever only as directed by your caregiver. ?SEEK IMMEDIATE MEDICAL CARE IF: ?You have increasing pain or severe headaches. ?You have nausea, vomiting, or drowsiness. ?You have swelling around your face. ?You have vision problems. ?You have a stiff neck. ?You have difficulty breathing. ?MAKE SURE YOU:  ?Understand these instructions. ?Will watch your condition. ?Will get help right away if you are not doing well or get worse. ?Document Released: 10/02/2005 Document Revised: 12/25/2011 Document  Reviewed: 10/17/2011 ?ExitCare? Patient Information ?2014 [employer]. ? ?If you have been instructed to have an in-person evaluation today at a local Urgent Care facility, please use the link below. It will take you to a list of all of our available [facility], including address, phone number and hours of operation. Please do not delay care.  ?[facility] ? ?If you or a family member do not have a primary care provider, use the link below to schedule a visit and establish care. When you choose a [facility] primary care physician or advanced practice provider, you gain a long-term partner in health. ?Find a Primary Care Provider ? ?Learn more about [facility] in-office and virtual care options: ?[facility]  ?

## 2022-01-18 ENCOUNTER — Encounter: Payer: Self-pay | Admitting: Physician Assistant

## 2022-01-19 NOTE — Telephone Encounter (Signed)
LVM for patient to call back to get appt scheduled. AM 

## 2022-04-19 ENCOUNTER — Telehealth: Payer: Self-pay | Admitting: Family Medicine

## 2022-04-19 NOTE — Telephone Encounter (Signed)
V.Mail left for patient to return call and schedule f/u prediabetes appt.

## 2022-04-19 NOTE — Telephone Encounter (Signed)
Please call patient.  Needs appointment for prediabetes follow-up.

## 2022-04-28 ENCOUNTER — Ambulatory Visit: Payer: BC Managed Care – PPO | Admitting: Physician Assistant

## 2022-04-28 DIAGNOSIS — Z8639 Personal history of other endocrine, nutritional and metabolic disease: Secondary | ICD-10-CM

## 2022-04-28 DIAGNOSIS — R7303 Prediabetes: Secondary | ICD-10-CM

## 2022-04-28 DIAGNOSIS — E010 Iodine-deficiency related diffuse (endemic) goiter: Secondary | ICD-10-CM

## 2022-04-28 DIAGNOSIS — Z79899 Other long term (current) drug therapy: Secondary | ICD-10-CM

## 2022-04-28 DIAGNOSIS — Z13220 Encounter for screening for lipoid disorders: Secondary | ICD-10-CM

## 2022-05-21 ENCOUNTER — Other Ambulatory Visit: Payer: Self-pay | Admitting: Physician Assistant

## 2022-06-14 ENCOUNTER — Encounter: Payer: Self-pay | Admitting: Physician Assistant

## 2022-06-23 ENCOUNTER — Ambulatory Visit (INDEPENDENT_AMBULATORY_CARE_PROVIDER_SITE_OTHER): Payer: BC Managed Care – PPO | Admitting: Physician Assistant

## 2022-06-23 ENCOUNTER — Encounter: Payer: Self-pay | Admitting: Physician Assistant

## 2022-06-23 VITALS — BP 143/86 | HR 93 | Ht 66.0 in | Wt 292.0 lb

## 2022-06-23 DIAGNOSIS — N12 Tubulo-interstitial nephritis, not specified as acute or chronic: Secondary | ICD-10-CM

## 2022-06-23 DIAGNOSIS — R519 Headache, unspecified: Secondary | ICD-10-CM

## 2022-06-23 DIAGNOSIS — N911 Secondary amenorrhea: Secondary | ICD-10-CM

## 2022-06-23 DIAGNOSIS — R109 Unspecified abdominal pain: Secondary | ICD-10-CM | POA: Diagnosis not present

## 2022-06-23 DIAGNOSIS — R1030 Lower abdominal pain, unspecified: Secondary | ICD-10-CM

## 2022-06-23 DIAGNOSIS — R110 Nausea: Secondary | ICD-10-CM | POA: Diagnosis not present

## 2022-06-23 DIAGNOSIS — R42 Dizziness and giddiness: Secondary | ICD-10-CM

## 2022-06-23 DIAGNOSIS — N912 Amenorrhea, unspecified: Secondary | ICD-10-CM

## 2022-06-23 LAB — POCT URINALYSIS DIP (CLINITEK)
Bilirubin, UA: NEGATIVE
Glucose, UA: NEGATIVE mg/dL
Leukocytes, UA: NEGATIVE
Nitrite, UA: POSITIVE — AB
Spec Grav, UA: 1.02 (ref 1.010–1.025)
Urobilinogen, UA: 0.2 E.U./dL
pH, UA: 6 (ref 5.0–8.0)

## 2022-06-23 LAB — POCT URINE PREGNANCY: Preg Test, Ur: NEGATIVE

## 2022-06-23 MED ORDER — CIPROFLOXACIN HCL 500 MG PO TABS: 500.0000 mg | ORAL_TABLET | Freq: Two times a day (BID) | ORAL | 0 refills | Status: AC

## 2022-06-23 NOTE — Progress Notes (Unsigned)
   Acute Office Visit  Subjective:     Patient ID: Carrie Spears, female    DOB: 05/13/2001, 21 y.o.   MRN: [id number]  No chief complaint on file.   HPI Patient is in today for ***  LMP 7/19 Nuva ring out 2 weeks before that Sex 8/9   ROS      Objective:    LMP 04/30/2022 (Exact Date)  {Vitals History (Optional):23777}  Physical Exam  No results found for any visits on 06/23/22.      Assessment & Plan:   Problem List Items Addressed This Visit   None   No orders of the defined types were placed in this encounter.   No follow-ups on file.  Tandy Gaw, PA-C

## 2022-06-23 NOTE — Patient Instructions (Addendum)
If labs normal will order pelvic ultrasound  Pyelonephritis, Adult Pyelonephritis is an infection that occurs in the kidney. The kidneys are the organs that filter a person's blood and move waste out of the bloodstream and into the urine. Urine passes from the kidneys, through tubes called ureters, and into the bladder. There are two main types of pyelonephritis: Infections that come on quickly without any warning (acute pyelonephritis). Infections that last for a long period of time (chronic pyelonephritis). In most cases, the infection clears up with treatment and does not cause further problems. More severe infections or chronic infections can sometimes spread to the bloodstream or lead to other problems with the kidneys. What are the causes? This condition is usually caused by: Bacteria traveling from the bladder up to the kidney. This may occur after having a bladder infection (cystitis) or urinary tract infection (UTI). Bladder infections caused from bacteria traveling from the bloodstream to the kidney. What increases the risk? This condition is more likely to develop in: Pregnant women. Older people. People who have any of these conditions: Diabetes. Inflammation of the prostate gland (prostatitis), in males. Kidney stones or bladder stones. Other abnormalities of the kidney or ureter. Cancer. People who have a catheter placed in the bladder. People who are sexually active. Women who use spermicides. People who have had a prior UTI. What are the signs or symptoms? Symptoms of this condition include: Frequent urination. Strong or persistent urge to urinate. Burning or stinging when urinating. Abdominal pain. Back pain. Pain in the side or flank area. Fever or chills. Blood in the urine, or dark urine. Nausea or vomiting. How is this diagnosed? This condition may be diagnosed based on: Your medical history and a physical exam. Urine tests. Blood tests. You may also have  imaging tests of the kidneys, such as an ultrasound or CT scan. How is this treated? Treatment for this condition may depend on the severity of the infection. If the infection is mild and is found early, you may be treated with antibiotic medicines taken by mouth (orally). You will need to drink fluids to remain hydrated. If the infection is more severe, you may need to stay in the hospital and receive antibiotics given directly into a vein through an IV. You may also need to receive fluids through an IV if you are not able to remain hydrated. After your hospital stay, you may need to take oral antibiotics for a period of time. Other treatments may be required, depending on the cause of the infection. Follow these instructions at home: Medicines Take your antibiotic medicine as told by your health care provider. Do not stop taking the antibiotic even if you start to feel better. Take over-the-counter and prescription medicines only as told by your health care provider. General instructions  Drink enough fluid to keep your urine pale yellow. Avoid caffeine, tea, and carbonated beverages. They tend to irritate the bladder. Urinate often. Avoid holding in urine for long periods of time. Urinate before and after sex. After a bowel movement, women should cleanse from front to back. Use each tissue only once. Keep all follow-up visits as told by your health care provider. This is important. Contact a health care provider if: Your symptoms do not get better after 2 days of treatment. Your symptoms get worse. You have a fever. Get help right away if you: Are unable to take your antibiotics or fluids. Have shaking chills. Vomit. Have severe flank or back pain. Have extreme weakness or  fainting. Summary Pyelonephritis is a urinary tract infection (UTI) that occurs in the kidney. Treatment for this condition may depend on the severity of the infection. Take your antibiotic medicine as told by  your health care provider. Do not stop taking the antibiotic even if you start to feel better. Drink enough fluid to keep your urine pale yellow. Keep all follow-up visits as told by your health care provider. This is important. This information is not intended to replace advice given to you by your health care provider. Make sure you discuss any questions you have with your health care provider. Document Revised: 05/12/2021 Document Reviewed: 05/12/2021 Elsevier Patient Education  2023 [employer].

## 2022-06-24 LAB — CBC WITH DIFFERENTIAL/PLATELET
Absolute Monocytes: 431 cells/uL (ref 200–950)
Basophils Absolute: 59 cells/uL (ref 0–200)
Basophils Relative: 0.6 %
Eosinophils Absolute: 451 cells/uL (ref 15–500)
Eosinophils Relative: 4.6 %
HCT: 38.1 % (ref 35.0–45.0)
Hemoglobin: 12.7 g/dL (ref 11.7–15.5)
Lymphs Abs: 3067 cells/uL (ref 850–3900)
MCH: 28.4 pg (ref 27.0–33.0)
MCHC: 33.3 g/dL (ref 32.0–36.0)
MCV: 85.2 fL (ref 80.0–100.0)
MPV: 9.2 fL (ref 7.5–12.5)
Monocytes Relative: 4.4 %
Neutro Abs: 5792 cells/uL (ref 1500–7800)
Neutrophils Relative %: 59.1 %
Platelets: 402 10*3/uL — ABNORMAL HIGH (ref 140–400)
RBC: 4.47 10*6/uL (ref 3.80–5.10)
RDW: 12.3 % (ref 11.0–15.0)
Total Lymphocyte: 31.3 %
WBC: 9.8 10*3/uL (ref 3.8–10.8)

## 2022-06-24 LAB — COMPLETE METABOLIC PANEL WITH GFR
AG Ratio: 1.3 (calc) (ref 1.0–2.5)
ALT: 26 U/L (ref 6–29)
AST: 19 U/L (ref 10–30)
Albumin: 4.1 g/dL (ref 3.6–5.1)
Alkaline phosphatase (APISO): 86 U/L (ref 31–125)
BUN: 8 mg/dL (ref 7–25)
CO2: 26 mmol/L (ref 20–32)
Calcium: 9.6 mg/dL (ref 8.6–10.2)
Chloride: 106 mmol/L (ref 98–110)
Creat: 0.65 mg/dL (ref 0.50–0.96)
Globulin: 3.2 g/dL (calc) (ref 1.9–3.7)
Glucose, Bld: 99 mg/dL (ref 65–99)
Potassium: 4.2 mmol/L (ref 3.5–5.3)
Sodium: 139 mmol/L (ref 135–146)
Total Bilirubin: 0.3 mg/dL (ref 0.2–1.2)
Total Protein: 7.3 g/dL (ref 6.1–8.1)
eGFR: 128 mL/min/{1.73_m2} (ref >=60)

## 2022-06-24 LAB — TSH: TSH: 2.39 mIU/L

## 2022-06-24 LAB — HCG, QUANTITATIVE, PREGNANCY: HCG, Total, QN: <5 m[IU]/mL

## 2022-06-24 LAB — HEMOGLOBIN A1C
Hgb A1c MFr Bld: 5.7 % of total Hgb — ABNORMAL HIGH (ref <5.7)
Mean Plasma Glucose: 117 mg/dL
eAG (mmol/L): 6.5 mmol/L

## 2022-06-25 ENCOUNTER — Other Ambulatory Visit: Payer: Self-pay | Admitting: Physician Assistant

## 2022-06-25 LAB — URINE CULTURE
MICRO NUMBER:: 13895165
Result:: NO GROWTH
SPECIMEN QUALITY:: ADEQUATE

## 2022-06-26 ENCOUNTER — Encounter: Payer: Self-pay | Admitting: Physician Assistant

## 2022-06-26 DIAGNOSIS — N911 Secondary amenorrhea: Secondary | ICD-10-CM | POA: Insufficient documentation

## 2022-06-26 DIAGNOSIS — R109 Unspecified abdominal pain: Secondary | ICD-10-CM | POA: Insufficient documentation

## 2022-06-26 NOTE — Progress Notes (Addendum)
Carrie Spears,   Thyroid stable.  Kidney, liver, glucose look good.  Confirmed not pregnant.  A1C stablet in pre-diabetes range at 5.7.  Normal hemoglobin and WBC.  Your urine culture does not show any bacteria as well.  It does not look to be a kidney infection.  Stop cipro at 3 days.  How are you feeling?   Will order pelivc ultrasound

## 2022-06-30 ENCOUNTER — Other Ambulatory Visit: Payer: BC Managed Care – PPO

## 2022-07-10 ENCOUNTER — Encounter: Payer: Self-pay | Admitting: Physician Assistant

## 2022-07-10 ENCOUNTER — Other Ambulatory Visit: Payer: BC Managed Care – PPO

## 2022-07-19 ENCOUNTER — Telehealth: Payer: BC Managed Care – PPO | Admitting: Physician Assistant

## 2022-07-19 DIAGNOSIS — N912 Amenorrhea, unspecified: Secondary | ICD-10-CM

## 2022-07-19 DIAGNOSIS — M545 Low back pain, unspecified: Secondary | ICD-10-CM

## 2022-07-19 DIAGNOSIS — R5383 Other fatigue: Secondary | ICD-10-CM

## 2022-07-19 NOTE — Progress Notes (Signed)
Because of having all these issues, I feel your condition warrants further evaluation and I recommend that you be seen for a face to face visit.  Please contact your primary care physician practice to be seen. Many offices offer virtual options to be seen via video if you are not comfortable going in person to a medical facility at this time. It may be best to call your Primary Care Office to follow up, since they have seen you for these issues already and continue the work up.  NOTE: You will NOT be charged for this eVisit.  If you do not have a PCP, [facility] offers a free physician referral service available at [phone]. Our trained staff has the experience, knowledge and resources to put you in touch with a physician who is right for you.    If you are having a true medical emergency please call 911.      For an urgent face to face visit, [facility] has seven urgent care centers for your convenience:     [facility] at  Get Driving Directions [phone] [address] [place], [place] [address]    [facility]) Get Driving Directions [phone] [place], [place] [address]  [facility] ([place]) Get Driving Directions [phone] [address] [place],  [place]  [address]  [facility] - at [address] [place] Get Driving Directions  [phone] [place],  [place] [address]   [facility] Get Driving Directions [phone] [address] [place], [place], [place] [address]   [facility] Get Driving Directions  [phone] [address], [place] [address]   [facility] [place] Get Driving Directions [phone] [address], [place],   [address]  Your MyChart E-visit questionnaire answers were reviewed by a board certified advanced clinical practitioner to complete your personal care plan based on your specific symptoms.  Thank you for using e-Visits.   I provided 5 minutes of non face-to-face time during this encounter for chart review and documentation.

## 2022-08-11 ENCOUNTER — Other Ambulatory Visit: Payer: BC Managed Care – PPO

## 2022-10-25 ENCOUNTER — Telehealth (INDEPENDENT_AMBULATORY_CARE_PROVIDER_SITE_OTHER): Payer: BC Managed Care – PPO | Admitting: Physician Assistant

## 2022-10-25 ENCOUNTER — Encounter: Payer: Self-pay | Admitting: Physician Assistant

## 2022-10-25 VITALS — Ht 66.0 in | Wt 299.0 lb

## 2022-10-25 DIAGNOSIS — J039 Acute tonsillitis, unspecified: Secondary | ICD-10-CM

## 2022-10-25 MED ORDER — AMOXICILLIN-POT CLAVULANATE 875-125 MG PO TABS: 1.0000 | ORAL_TABLET | Freq: Two times a day (BID) | ORAL | 0 refills | Status: DC

## 2022-10-25 MED ORDER — PREDNISONE 20 MG PO TABS: ORAL_TABLET | ORAL | 0 refills | Status: DC

## 2022-10-25 NOTE — Progress Notes (Signed)
..  Virtual Visit via Video Note  I connected with Carrie Spears on 10/25/22 at  7:30 AM EST by a video enabled telemedicine application and verified that I am speaking with the correct person using two identifiers.  Location: Patient: home Provider: clinic  .Carrie KitchenParticipating in visit:  Patient: Carrie Spears  Provider: Iran Planas PA-C   I discussed the limitations of evaluation and management by telemedicine and the availability of in person appointments. The patient expressed understanding and agreed to proceed.  History of Present Illness: Pt is a 22 yo female who calls into the clinic with ST, trouble swallowing, body aches for the last 2 days. She actually had symptoms but took "left over augmentin" and got better but then 2 days ago symptoms started worsening again. She did not go to school yesterday. No recent fever. No SOB. Not noticed any drainage on tonsils.    .. Active Ambulatory Problems    Diagnosis Date Noted   ADHD (attention deficit hyperactivity disorder), combined type 09/10/2013   Prediabetes 12/28/2014   Mood swings 12/28/2014   Morbid obesity ([place]) 02/02/2016   ADD (attention deficit disorder) 02/02/2016   Other fatigue 02/02/2016   Thyromegaly 02/02/2016   Dysuria 02/02/2016   Vitamin D deficiency 02/08/2016   Elevated WBC count 02/08/2016   Hypothyroidism 02/08/2016   Reactive airway disease 12/15/2016   Tenderness of neck 12/09/2017   DUB (dysfunctional uterine bleeding) 07/08/2018   Palpitations 07/08/2018   Iron deficiency anemia 07/08/2018   Low energy 01/14/2019   Cervicitis and endocervicitis 08/08/2019   Adnexal tenderness 08/08/2019   Epigastric abdominal pain 01/26/2020   Nausea and vomiting 01/26/2020   Diarrhea 01/26/2020   Pregestational diabetes mellitus, 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   Assessment and Plan: Carrie KitchenMarland KitchenDiagnoses and all orders for this visit:  Acute tonsillitis, unspecified etiology -     amoxicillin-clavulanate (AUGMENTIN) 875-125 MG tablet; Take 1 tablet by mouth 2 (two) times daily. For 10 days. -     predniSONE (DELTASONE) 20 MG tablet; Take one table twice daily.   Treated with augmentin and prednisone Ok to gargle with salt water and take tylenol for pain Follow up as needed or if symptoms persist or worsen Note for 1/9 and 1/10 missed school days   Follow Up Instructions:    I discussed the assessment and treatment plan with the patient. The patient was provided an opportunity to ask questions and all were answered. The patient agreed with the plan and demonstrated an understanding of the instructions.   The patient was advised to call back or seek an in-person evaluation if the symptoms worsen or if the condition fails to improve as anticipated.   Iran Planas, PA-C

## 2022-10-25 NOTE — Progress Notes (Signed)
Started two days ago Tonsils inflamed Hard to swallow  No other symptoms   She took some old Amoxicillin and symptoms seem better

## 2022-12-05 ENCOUNTER — Other Ambulatory Visit: Payer: Self-pay | Admitting: Physician Assistant

## 2022-12-05 DIAGNOSIS — J039 Acute tonsillitis, unspecified: Secondary | ICD-10-CM

## 2022-12-07 ENCOUNTER — Telehealth: Payer: BC Managed Care – PPO | Admitting: Physician Assistant

## 2022-12-07 DIAGNOSIS — J0391 Acute recurrent tonsillitis, unspecified: Secondary | ICD-10-CM | POA: Diagnosis not present

## 2022-12-08 MED ORDER — PREDNISONE 20 MG PO TABS: ORAL_TABLET | ORAL | 0 refills | Status: DC

## 2022-12-08 MED ORDER — DOXYCYCLINE HYCLATE 100 MG PO TABS: 100.0000 mg | ORAL_TABLET | Freq: Two times a day (BID) | ORAL | 0 refills | Status: DC

## 2022-12-08 NOTE — Progress Notes (Signed)
E-Visit for Sore Throat   We are sorry that you are not feeling well.  Here is how we plan to help!  Based on what you have shared with me it is likely that you have Tonsillitis.  Tonsillitis is inflammation and infection in the back of the throat.  This is an infection cause by bacteria and is treated with antibiotics.  I have prescribed Doxycycline '100mg'$  Take 1 tablet twice daily for 10 days. I have also prescribed Prednisone '20mg'$  Take 2 tablets ('40mg'$ ) daily for 5 days for swelling. For throat pain, we recommend over the counter oral pain relief medications such as acetaminophen or aspirin, or anti-inflammatory medications such as ibuprofen or naproxen sodium. Topical treatments such as oral throat lozenges or sprays may be used as needed. Throat infections are not as easily transmitted as other respiratory infections, however we still recommend that you avoid close contact with loved ones, especially the very young and elderly.  Remember to wash your hands thoroughly throughout the day as this is the number one way to prevent the spread of infection and wipe down door knobs and counters with disinfectant.   Home Care: Only take medications as instructed by your medical team. Complete the entire course of an antibiotic. Do not take these medications with alcohol. A steam or ultrasonic humidifier can help congestion.  You can place a towel over your head and breathe in the steam from hot water coming from a faucet. Avoid close contacts especially the very young and the elderly. Cover your mouth when you cough or sneeze. Always remember to wash your hands.  Get Help Right Away If: You develop worsening fever or sinus pain. You develop a severe head ache or visual changes. Your symptoms persist after you have completed your treatment plan.  Make sure you Understand these instructions. Will watch your condition. Will get help right away if you are not doing well or get worse.   Thank you for  choosing an e-visit.  Your e-visit answers were reviewed by a board certified advanced clinical practitioner to complete your personal care plan. Depending upon the condition, your plan could have included both over the counter or prescription medications.  Please review your pharmacy choice. Make sure the pharmacy is open so you can pick up prescription now. If there is a problem, you may contact your provider through [employer] and have the prescription routed to another pharmacy.  Your safety is important to [place]. If you have drug allergies check your prescription carefully.   For the next 24 hours you can use MyChart to ask questions about today's visit, request a non-urgent call back, or ask for a work or school excuse. You will get an email in the next two days asking about your experience. I hope that your e-visit has been valuable and will speed your recovery.  I have spent 5 minutes in review of e-visit questionnaire, review and updating patient chart, medical decision making and response to patient.   Mar Daring, PA-C

## 2022-12-15 ENCOUNTER — Encounter: Payer: Self-pay | Admitting: Physician Assistant

## 2022-12-15 MED ORDER — FLUCONAZOLE 150 MG PO TABS: 150.0000 mg | ORAL_TABLET | Freq: Once | ORAL | 0 refills | Status: AC

## 2023-01-07 ENCOUNTER — Encounter: Payer: Self-pay | Admitting: Physician Assistant

## 2023-01-07 DIAGNOSIS — R4586 Emotional lability: Secondary | ICD-10-CM

## 2023-01-07 DIAGNOSIS — F902 Attention-deficit hyperactivity disorder, combined type: Secondary | ICD-10-CM

## 2023-01-07 DIAGNOSIS — F339 Major depressive disorder, recurrent, unspecified: Secondary | ICD-10-CM

## 2023-01-09 MED ORDER — SERTRALINE HCL 100 MG PO TABS: ORAL_TABLET | ORAL | 0 refills | Status: DC

## 2023-01-09 NOTE — Addendum Note (Signed)
Addended by: Donella Stade on: 01/09/2023 12:52 PM   Modules accepted: Orders

## 2023-01-09 NOTE — Addendum Note (Signed)
Addended by: Donella Stade on: 01/09/2023 03:06 PM   Modules accepted: Orders

## 2023-01-19 ENCOUNTER — Encounter: Payer: Self-pay | Admitting: Physician Assistant

## 2023-02-02 ENCOUNTER — Ambulatory Visit: Payer: BC Managed Care – PPO | Admitting: Physician Assistant

## 2023-02-05 ENCOUNTER — Ambulatory Visit: Payer: BC Managed Care – PPO | Admitting: Professional

## 2023-02-07 ENCOUNTER — Encounter: Payer: Self-pay | Admitting: Physician Assistant

## 2023-02-08 ENCOUNTER — Encounter: Payer: Self-pay | Admitting: Physician Assistant

## 2023-06-19 ENCOUNTER — Ambulatory Visit (INDEPENDENT_AMBULATORY_CARE_PROVIDER_SITE_OTHER): Payer: BC Managed Care – PPO | Admitting: Physician Assistant

## 2023-06-19 ENCOUNTER — Other Ambulatory Visit: Payer: Self-pay | Admitting: Physician Assistant

## 2023-06-19 VITALS — BP 125/72 | HR 94 | Temp 98.6°F | Ht 66.0 in | Wt 272.1 lb

## 2023-06-19 DIAGNOSIS — Z23 Encounter for immunization: Secondary | ICD-10-CM | POA: Diagnosis not present

## 2023-06-19 DIAGNOSIS — N926 Irregular menstruation, unspecified: Secondary | ICD-10-CM | POA: Diagnosis not present

## 2023-06-19 DIAGNOSIS — R350 Frequency of micturition: Secondary | ICD-10-CM

## 2023-06-19 DIAGNOSIS — E66813 Obesity, class 3: Secondary | ICD-10-CM

## 2023-06-19 DIAGNOSIS — R1031 Right lower quadrant pain: Secondary | ICD-10-CM

## 2023-06-19 DIAGNOSIS — R1032 Left lower quadrant pain: Secondary | ICD-10-CM

## 2023-06-19 DIAGNOSIS — R7303 Prediabetes: Secondary | ICD-10-CM | POA: Diagnosis not present

## 2023-06-19 DIAGNOSIS — R5383 Other fatigue: Secondary | ICD-10-CM

## 2023-06-19 DIAGNOSIS — R4586 Emotional lability: Secondary | ICD-10-CM

## 2023-06-19 DIAGNOSIS — F339 Major depressive disorder, recurrent, unspecified: Secondary | ICD-10-CM

## 2023-06-19 DIAGNOSIS — Z6841 Body Mass Index (BMI) 40.0 and over, adult: Secondary | ICD-10-CM

## 2023-06-19 DIAGNOSIS — N912 Amenorrhea, unspecified: Secondary | ICD-10-CM

## 2023-06-19 LAB — POCT URINALYSIS DIP (CLINITEK)
Bilirubin, UA: NEGATIVE
Glucose, UA: NEGATIVE mg/dL
Ketones, POC UA: NEGATIVE mg/dL
Nitrite, UA: NEGATIVE
Spec Grav, UA: >=1.03 — AB (ref 1.010–1.025)
Urobilinogen, UA: 0.2 U/dL
pH, UA: 6 (ref 5.0–8.0)

## 2023-06-19 LAB — POCT URINE PREGNANCY: Preg Test, Ur: NEGATIVE

## 2023-06-19 MED ORDER — CEPHALEXIN 500 MG PO CAPS: 500.0000 mg | ORAL_CAPSULE | Freq: Two times a day (BID) | ORAL | 0 refills | Status: DC

## 2023-06-19 NOTE — Progress Notes (Signed)
Acute Office Visit  Subjective:     Patient ID: Carrie Spears, female    DOB: 2001-07-31, 22 y.o.   MRN: [id number]  No chief complaint on file.   HPI Patient is in today for nausea, no energy, missed period. She is concerned about pregnancy. She has not been on birth control for the last month.   She is late for her period but has had some light pinkish discharge. For the last few weeks she has felt crampy, moody, tired, and nauseated. She is concerned about pregnancy. She is sexually active.   She has hx of UTI and does admit to increase in urine frequency. No fever, chills, flank or abdominal pain.   .. Active Ambulatory Problems    Diagnosis Date Noted   ADHD (attention deficit hyperactivity disorder), combined type 09/10/2013   Prediabetes 12/28/2014   Mood swings 12/28/2014   Morbid obesity (HCC) 02/02/2016   ADD (attention deficit disorder) 02/02/2016   Other fatigue 02/02/2016   Thyromegaly 02/02/2016   Dysuria 02/02/2016   Vitamin D deficiency 02/08/2016   Elevated WBC count 02/08/2016   Hypothyroidism 02/08/2016   Reactive airway disease 12/15/2016   Tenderness of neck 12/09/2017   DUB (dysfunctional uterine bleeding) 07/08/2018   Palpitations 07/08/2018   Iron deficiency anemia 07/08/2018   Low energy 01/14/2019   Cervicitis and endocervicitis 08/08/2019   Adnexal tenderness 08/08/2019   Epigastric abdominal pain 01/26/2020   Nausea and vomiting 01/26/2020   Diarrhea 01/26/2020   Pregestational diabetes mellitus, modified White class F (HCC) 01/26/2020   Depression, recurrent (HCC) 08/18/2020   Secondary amenorrhea 06/26/2022   Bilateral flank pain 06/26/2022   Resolved Ambulatory Problems    Diagnosis Date Noted   Neck tightness 12/09/2017   Viral URI 05/10/2020   Past Medical History:  Diagnosis Date   ADHD (attention deficit hyperactivity disorder)      ROS  See HPI.     Objective:    There were no vitals taken for this visit. BP  Readings from Last 3 Encounters:  06/19/23 125/72  06/23/22 (!) 143/86  08/24/21 119/69   Wt Readings from Last 3 Encounters:  06/19/23 272 lb 1.9 oz (123.4 kg)  10/25/22 299 lb (135.6 kg)  06/23/22 292 lb (132.5 kg)    .Marland Kitchen Results for orders placed or performed in visit on 06/19/23  TSH + free T4  Result Value Ref Range   TSH 1.210 0.450 - 4.500 uIU/mL   Free T4 1.15 0.82 - 1.77 ng/dL  Hemoglobin F6E  Result Value Ref Range   Hgb A1c MFr Bld 6.2 (H) 4.8 - 5.6 %   Est. average glucose Bld gHb Est-mCnc 131 mg/dL  PPI95+JOAC  Result Value Ref Range   Glucose 81 70 - 99 mg/dL   BUN 8 6 - 20 mg/dL   Creatinine, Ser 1.66 0.57 - 1.00 mg/dL   eGFR 063 >01 SW/FUX/3.23   BUN/Creatinine Ratio 13 9 - 23   Sodium 140 134 - 144 mmol/L   Potassium 4.1 3.5 - 5.2 mmol/L   Chloride 104 96 - 106 mmol/L   CO2 22 20 - 29 mmol/L   Calcium 9.1 8.7 - 10.2 mg/dL   Total Protein 6.9 6.0 - 8.5 g/dL   Albumin 4.0 4.0 - 5.0 g/dL   Globulin, Total 2.9 1.5 - 4.5 g/dL   Bilirubin Total 0.2 0.0 - 1.2 mg/dL   Alkaline Phosphatase 98 44 - 121 IU/L   AST 16 0 - 40 IU/L   ALT  18 0 - 32 IU/L  CBC w/Diff/Platelet  Result Value Ref Range   WBC 10.5 3.4 - 10.8 x10E3/uL   RBC 4.12 3.77 - 5.28 x10E6/uL   Hemoglobin 11.7 11.1 - 15.9 g/dL   Hematocrit 16.1 09.6 - 46.6 %   MCV 88 79 - 97 fL   MCH 28.4 26.6 - 33.0 pg   MCHC 32.1 31.5 - 35.7 g/dL   RDW 04.5 40.9 - 81.1 %   Platelets 144 (L) 150 - 450 x10E3/uL   Neutrophils 65 Not Estab. %   Lymphs 28 Not Estab. %   Monocytes 3 Not Estab. %   Eos 3 Not Estab. %   Basos 1 Not Estab. %   Neutrophils Absolute 6.8 1.4 - 7.0 x10E3/uL   Lymphocytes Absolute 3.0 0.7 - 3.1 x10E3/uL   Monocytes Absolute 0.4 0.1 - 0.9 x10E3/uL   EOS (ABSOLUTE) 0.3 0.0 - 0.4 x10E3/uL   Basophils Absolute 0.1 0.0 - 0.2 x10E3/uL   Immature Granulocytes 0 Not Estab. %   Immature Grans (Abs) 0.0 0.0 - 0.1 x10E3/uL  Fe+TIBC+Fer  Result Value Ref Range   Total Iron Binding Capacity  362 250 - 450 ug/dL   UIBC 914 782 - 956 ug/dL   Iron 76 27 - 213 ug/dL   Iron Saturation 21 15 - 55 %   Ferritin 44 15 - 150 ng/mL  Beta hCG quant (ref lab)  Result Value Ref Range   hCG Quant <1 mIU/mL  POCT urine pregnancy  Result Value Ref Range   Preg Test, Ur Negative Negative  POCT URINALYSIS DIP (CLINITEK)  Result Value Ref Range   Color, UA yellow yellow   Clarity, UA cloudy (A) clear   Glucose, UA negative negative mg/dL   Bilirubin, UA negative negative   Ketones, POC UA negative negative mg/dL   Spec Grav, UA >=0.865 (A) 1.010 - 1.025   Blood, UA moderate (A) negative   pH, UA 6.0 5.0 - 8.0   POC PROTEIN,UA trace negative, trace   Urobilinogen, UA 0.2 0.2 or 1.0 E.U./dL   Nitrite, UA Negative Negative   Leukocytes, UA Small (1+) (A) Negative     Physical Exam Constitutional:      Appearance: Normal appearance. She is obese.  HENT:     Head: Normocephalic.  Cardiovascular:     Rate and Rhythm: Normal rate and regular rhythm.     Heart sounds: Normal heart sounds.  Pulmonary:     Effort: Pulmonary effort is normal.     Breath sounds: Normal breath sounds.  Abdominal:     General: There is no distension.     Palpations: Abdomen is soft. There is no mass.     Tenderness: There is no abdominal tenderness. There is no right CVA tenderness, left CVA tenderness, guarding or rebound.     Hernia: No hernia is present.  Musculoskeletal:     Cervical back: Normal range of motion and neck supple. No tenderness.  Lymphadenopathy:     Cervical: No cervical adenopathy.  Neurological:     General: No focal deficit present.     Mental Status: She is alert and oriented to person, place, and time.  Psychiatric:        Mood and Affect: Mood normal.          Assessment & Plan:  Marland KitchenMarland KitchenOleatha was seen today for pelvic pain.  Diagnoses and all orders for this visit:  Missed period -     POCT urine pregnancy -  TSH + free T4 -     Hemoglobin A1c -     B-HCG  Quant -     CMP14+EGFR -     CBC w/Diff/Platelet -     Fe+TIBC+Fer -     PCOS Diagnostic Profile -     Beta hCG quant (ref lab)  Immunization due -     Tdap vaccine greater than or equal to 7yo IM -     Flu vaccine trivalent PF, 6mos and older(Flulaval,Afluria,Fluarix,Fluzone) -     Beta hCG quant (ref lab)  Bilateral lower abdominal cramping -     TSH + free T4 -     Hemoglobin A1c -     B-HCG Quant -     CMP14+EGFR -     CBC w/Diff/Platelet -     Fe+TIBC+Fer -     Beta hCG quant (ref lab)  Class 3 severe obesity due to excess calories without serious comorbidity with body mass index (BMI) of 40.0 to 44.9 in adult (HCC) -     TSH + free T4 -     Hemoglobin A1c -     B-HCG Quant -     CMP14+EGFR -     CBC w/Diff/Platelet -     Fe+TIBC+Fer -     PCOS Diagnostic Profile -     Beta hCG quant (ref lab)  Pre-diabetes -     Hemoglobin A1c -     PCOS Diagnostic Profile -     Beta hCG quant (ref lab)  No energy -     TSH + free T4 -     Hemoglobin A1c -     B-HCG Quant -     CMP14+EGFR -     CBC w/Diff/Platelet -     Fe+TIBC+Fer -     PCOS Diagnostic Profile -     Beta hCG quant (ref lab)  Urinary frequency -     POCT URINALYSIS DIP (CLINITEK) -     Urine Culture -     Beta hCG quant (ref lab)   Vitals look great Hx of UTI, UA showed blood, leuks, protein Sent keflex to pharmacy Will culture UPT negative Will confirm with blood test Labs ordered to evaluate for fatigue and to recheck A1C and diabetes ?PCOS-panel ordered Discussed PCOS symptoms and how to manage Follow up in 4 weeks after labs   Tandy Gaw, PA-C

## 2023-06-19 NOTE — Progress Notes (Signed)
Call pt: sent keflex for UTI detected in urine.

## 2023-06-19 NOTE — Patient Instructions (Signed)
Polycystic Ovary Syndrome  Polycystic ovarian syndrome (PCOS) is a common hormonal disorder among women of reproductive age. In most women with PCOS, small fluid-filled sacs (cysts) grow on the ovaries. PCOS can cause problems with menstrual periods and make it hard to get and stay pregnant. If this condition is not treated, it can lead to serious health problems, such as diabetes and heart disease. What are the causes? The cause of this condition is not known. It may be due to certain factors, such as: Irregular menstrual cycle. High levels of certain hormones. Problems with the hormone that helps to control blood sugar (insulin). Certain genes. What increases the risk? You are more likely to develop this condition if you: Have a family history of PCOS or type 2 diabetes. Are overweight, eat unhealthy foods, and are not active. These factors may cause problems with blood sugar control, which can contribute to PCOS or PCOS symptoms. What are the signs or symptoms? Symptoms of this condition include: Ovarian cysts and sometimes pelvic pain. Menstrual periods that are not regular or are too heavy. Inability to get or stay pregnant. Increased growth of hair on the face, chest, stomach, back, thumbs, thighs, or toes. Acne or oily skin. Acne may develop during adulthood, and it may not get better with treatment. Weight gain or obesity. Patches of thickened and dark brown or black skin on the neck, arms, breasts, or thighs. How is this diagnosed? This condition is diagnosed based on: Your medical history. A physical exam that includes a pelvic exam. Your health care provider may look for areas of increased hair growth on your skin. Tests, such as: An ultrasound to check the ovaries for cysts and to view the lining of the uterus. Blood tests to check levels of sugar (glucose), female hormone (testosterone), and female hormones (estrogen and progesterone). How is this treated? There is no cure  for this condition, but treatment can help to manage symptoms and prevent more health problems from developing. Treatment varies depending on your symptoms and if you want to have a baby or if you need birth control. Treatment may include: Making nutrition and lifestyle changes. Taking the progesterone hormone to start a menstrual period. Taking birth control pills to help you have regular menstrual periods. Taking medicines such as: Medicines to make you ovulate, if you want to get pregnant. Medicine to reduce extra hair growth. Having surgery in severe cases. This may involve making small holes in one or both of your ovaries. This decreases the amount of testosterone that your body makes. Follow these instructions at home: Take over-the-counter and prescription medicines only as told by your health care provider. Follow a healthy meal plan that includes lean proteins, complex carbohydrates, fresh fruits and vegetables, low-fat dairy products, healthy fats, and fiber. If you are overweight, lose weight as told by your health care provider. Your health care provider can determine how much weight loss is best for you and can help you lose weight safely. Keep all follow-up visits. This is important. Contact a health care provider if: Your symptoms do not get better with medicine. Your symptoms get worse or you develop new symptoms. Summary Polycystic ovarian syndrome (PCOS) is a common hormonal disorder among women of reproductive age. PCOS can cause problems with menstrual periods and make it hard to get and stay pregnant. If this condition is not treated, it can lead to serious health problems, such as diabetes and heart disease. There is no cure for this condition, but treatment   can help to manage symptoms and prevent more health problems from developing. This information is not intended to replace advice given to you by your health care provider. Make sure you discuss any questions you have  with your health care provider. Document Revised: 03/11/2020 Document Reviewed: 03/11/2020 Elsevier Patient Education  2024 [employer]vier Inc.  

## 2023-06-20 ENCOUNTER — Encounter: Payer: Self-pay | Admitting: Physician Assistant

## 2023-06-20 DIAGNOSIS — R350 Frequency of micturition: Secondary | ICD-10-CM | POA: Insufficient documentation

## 2023-06-20 LAB — HEMOGLOBIN A1C
Est. average glucose Bld gHb Est-mCnc: 131 mg/dL
Hgb A1c MFr Bld: 6.2 % — ABNORMAL HIGH (ref 4.8–5.6)

## 2023-06-20 LAB — CMP14+EGFR
ALT: 18 IU/L (ref 0–32)
AST: 16 IU/L (ref 0–40)
Albumin: 4 g/dL (ref 4.0–5.0)
Alkaline Phosphatase: 98 IU/L (ref 44–121)
BUN/Creatinine Ratio: 13 (ref 9–23)
BUN: 8 mg/dL (ref 6–20)
Bilirubin Total: 0.2 mg/dL (ref 0.0–1.2)
CO2: 22 mmol/L (ref 20–29)
Calcium: 9.1 mg/dL (ref 8.7–10.2)
Chloride: 104 mmol/L (ref 96–106)
Creatinine, Ser: 0.61 mg/dL (ref 0.57–1.00)
Globulin, Total: 2.9 g/dL (ref 1.5–4.5)
Glucose: 81 mg/dL (ref 70–99)
Potassium: 4.1 mmol/L (ref 3.5–5.2)
Sodium: 140 mmol/L (ref 134–144)
Total Protein: 6.9 g/dL (ref 6.0–8.5)
eGFR: 129 mL/min/{1.73_m2} (ref >59)

## 2023-06-20 LAB — CBC WITH DIFFERENTIAL/PLATELET
Basophils Absolute: 0.1 10*3/uL (ref 0.0–0.2)
Basos: 1 %
EOS (ABSOLUTE): 0.3 10*3/uL (ref 0.0–0.4)
Eos: 3 %
Hematocrit: 36.4 % (ref 34.0–46.6)
Hemoglobin: 11.7 g/dL (ref 11.1–15.9)
Immature Grans (Abs): 0 10*3/uL (ref 0.0–0.1)
Immature Granulocytes: 0 %
Lymphocytes Absolute: 3 10*3/uL (ref 0.7–3.1)
Lymphs: 28 %
MCH: 28.4 pg (ref 26.6–33.0)
MCHC: 32.1 g/dL (ref 31.5–35.7)
MCV: 88 fL (ref 79–97)
Monocytes Absolute: 0.4 10*3/uL (ref 0.1–0.9)
Monocytes: 3 %
Neutrophils Absolute: 6.8 10*3/uL (ref 1.4–7.0)
Neutrophils: 65 %
Platelets: 144 10*3/uL — ABNORMAL LOW (ref 150–450)
RBC: 4.12 x10E6/uL (ref 3.77–5.28)
RDW: 12.2 % (ref 11.7–15.4)
WBC: 10.5 10*3/uL (ref 3.4–10.8)

## 2023-06-20 LAB — BETA HCG QUANT (REF LAB): hCG Quant: <1 m[IU]/mL

## 2023-06-20 LAB — TSH+FREE T4
Free T4: 1.15 ng/dL (ref 0.82–1.77)
TSH: 1.21 u[IU]/mL (ref 0.450–4.500)

## 2023-06-20 LAB — IRON,TIBC AND FERRITIN PANEL
Ferritin: 44 ng/mL (ref 15–150)
Iron Saturation: 21 % (ref 15–55)
Iron: 76 ug/dL (ref 27–159)
Total Iron Binding Capacity: 362 ug/dL (ref 250–450)
UIBC: 286 ug/dL (ref 131–425)

## 2023-06-20 MED ORDER — METFORMIN HCL 500 MG PO TABS
500.0000 mg | ORAL_TABLET | Freq: Two times a day (BID) | ORAL | 1 refills | Status: DC
Start: 2023-06-20 — End: 2023-10-28

## 2023-06-20 MED ORDER — SERTRALINE HCL 100 MG PO TABS: ORAL_TABLET | ORAL | 0 refills | Status: DC

## 2023-06-20 NOTE — Progress Notes (Signed)
No anemia.  Confirmed not pregnant with labs.  Kidney and liver function look good.  Your A1C has worsened and closer to diabetes. I would suggest [place] starting metformin to help with glucose and help with insulin resistance. Thoughts?

## 2023-06-20 NOTE — Addendum Note (Signed)
Addended by: Jomarie Longs on: 06/20/2023 04:05 PM   Modules accepted: Orders

## 2023-06-21 LAB — URINE CULTURE

## 2023-06-22 NOTE — Progress Notes (Signed)
Finish keflex. No changes to medication.

## 2023-07-06 NOTE — Progress Notes (Signed)
Labs are trickling in but so far all in normal range.

## 2023-07-09 LAB — PCOS DIAGNOSTIC PROFILE
17-Alpha-Hydroxyprogesterone: 57 ng/dL
ANTI-MULLERIAN HORMONE (AMH): 9.47 ng/mL
DHEA-Sulfate, LCMS: 236 ug/dL
Estradiol, Serum, MS: 48 pg/mL
Follicle Stimulating Hormone: 8.2 m[IU]/mL
Free Testosterone, Serum: 9 pg/mL — ABNORMAL HIGH
Luteinizing Hormone (LH) ECL: 14 m[IU]/mL
Prolactin: 9.13 ng/mL
Sex Hormone Binding Globulin: 54.4 nmol/L
TSH: 1.3 uU/mL
Testosterone, Serum (Total): 69 ng/dL — ABNORMAL HIGH
Testosterone-% Free: 1.3 %

## 2023-07-09 NOTE — Progress Notes (Signed)
Testosterone levels are a little high and this is a sign of PCOS. If you are not actively trying to get pregnant going on birth control is a great way to regulate hormones while working on weight loss.

## 2023-07-11 NOTE — Progress Notes (Signed)
Ok to go to lab for blood work to check for pregnancy. I will order.

## 2023-07-11 NOTE — Addendum Note (Signed)
Addended byJomarie Longs on: 07/11/2023 12:22 PM   Modules accepted: Orders

## 2023-07-24 ENCOUNTER — Telehealth (INDEPENDENT_AMBULATORY_CARE_PROVIDER_SITE_OTHER): Payer: Self-pay | Admitting: Physician Assistant

## 2023-07-24 DIAGNOSIS — Z91199 Patient's noncompliance with other medical treatment and regimen due to unspecified reason: Secondary | ICD-10-CM

## 2023-07-24 NOTE — Progress Notes (Signed)
An attempt to reach was made at 800am left vm for pt to return call back to office

## 2023-07-24 NOTE — Progress Notes (Signed)
LM for patient to call and reschedule on VM.

## 2023-07-25 ENCOUNTER — Encounter: Payer: Self-pay | Admitting: Physician Assistant

## 2023-08-20 ENCOUNTER — Encounter: Payer: Self-pay | Admitting: Physician Assistant

## 2023-08-27 ENCOUNTER — Ambulatory Visit: Payer: BC Managed Care – PPO | Admitting: Physician Assistant

## 2023-08-28 ENCOUNTER — Ambulatory Visit: Payer: BC Managed Care – PPO | Admitting: Physician Assistant

## 2023-08-31 ENCOUNTER — Ambulatory Visit: Payer: BC Managed Care – PPO | Admitting: Physician Assistant

## 2023-09-17 ENCOUNTER — Emergency Department (HOSPITAL_COMMUNITY)
Admission: EM | Admit: 2023-09-17 | Discharge: 2023-09-17 | Disposition: A | Discharge status: Home / Self Care | Payer: BC Managed Care – PPO | Attending: Emergency Medicine | Admitting: Emergency Medicine

## 2023-09-17 ENCOUNTER — Encounter (HOSPITAL_COMMUNITY): Payer: Self-pay | Admitting: Emergency Medicine

## 2023-09-17 DIAGNOSIS — O219 Vomiting of pregnancy, unspecified: Secondary | ICD-10-CM | POA: Insufficient documentation

## 2023-09-17 DIAGNOSIS — Z3A01 Less than 8 weeks gestation of pregnancy: Secondary | ICD-10-CM | POA: Diagnosis not present

## 2023-09-17 DIAGNOSIS — Z3201 Encounter for pregnancy test, result positive: Secondary | ICD-10-CM | POA: Diagnosis not present

## 2023-09-17 LAB — PREGNANCY, URINE: Preg Test, Ur: POSITIVE — AB

## 2023-09-17 MED ORDER — ONDANSETRON HCL 4 MG PO TABS: 4.0000 mg | ORAL_TABLET | Freq: Four times a day (QID) | ORAL | 0 refills | Status: DC

## 2023-09-17 MED ORDER — ONDANSETRON 4 MG PO TBDP
4.0000 mg | ORAL_TABLET | Freq: Once | ORAL | Status: AC
  Administered 2023-09-17: 4 mg via ORAL
  Filled 2023-09-17: qty 1

## 2023-09-17 NOTE — ED Triage Notes (Addendum)
Pt reports vomiting since Sunday. Preg test positive tonight. No pregnancies in past. Denies pain. No GYN but sees PCP for pap smears. PCP is [facility].

## 2023-09-17 NOTE — Discharge Instructions (Addendum)
Follow up with an obstetrician of your choice for regular prenatal care. Use Zofran every 8 hours as needed for nausea.   If you should have any abdominal pain, vaginal bleeding or symptom of concern, go to [facility] and request to be seen at the Maternity Admission Unit (MAU).

## 2023-09-17 NOTE — ED Provider Notes (Signed)
EMERGENCY DEPARTMENT AT [facility] Provider Note   CSN: [id number] Arrival date & time: 09/17/23  2032     History  Chief Complaint  Patient presents with   Emesis During Pregnancy    Carrie Spears is a 22 y.o. female.  Patient woke Sunday (yesterday) with nausea and has been persistently symptomatic since with vomiting. She reports breast tenderness. She took a home pregnancy test that was positive. No vaginal bleeding or abdominal pain.   The history is provided by the patient. No language interpreter was used.       Home Medications Prior to Admission medications   Medication Sig Start Date End Date Taking? Authorizing Provider  ondansetron (ZOFRAN) 4 MG tablet Take 1 tablet (4 mg total) by mouth every 6 (six) hours. 09/17/23  Yes Jaselyn Nahm, Melvenia Beam, PA-C  cephALEXin (KEFLEX) 500 MG capsule Take 1 capsule (500 mg total) by mouth 2 (two) times daily. 06/19/23   Breeback, Lonna Cobb, PA-C  metFORMIN (GLUCOPHAGE) 500 MG tablet Take 1 tablet (500 mg total) by mouth 2 (two) times daily with a meal. 06/20/23   Breeback, Jade L, PA-C  naproxen (NAPROSYN) 500 MG tablet TAKE 1 TABLET BY MOUTH 2 TIMES DAILY WITH A MEAL. 06/26/22   Breeback, Jade L, PA-C  sertraline (ZOLOFT) 100 MG tablet Take one tablet daily. 06/20/23   Breeback, Lonna Cobb, PA-C  valACYclovir (VALTREX) 500 MG tablet Take 1 tablet (500 mg total) by mouth daily. 08/24/21   Jomarie Longs, PA-C      Allergies    Patient has no known allergies.    Review of Systems   Review of Systems  Physical Exam Updated Vital Signs BP (!) 142/63 (BP Location: Left Arm)   Pulse 78   Temp 98.2 F (36.8 C) (Oral)   Resp 18   Ht 5\' 6"  (1.676 m)   Wt 107.5 kg   LMP 08/17/2023   SpO2 98%   BMI 38.27 kg/m  Physical Exam Vitals and nursing note reviewed.  Constitutional:      General: She is not in acute distress.    Appearance: She is well-developed. She is not ill-appearing.  Pulmonary:     Effort: Pulmonary  effort is normal.  Musculoskeletal:        General: Normal range of motion.     Cervical back: Normal range of motion.  Skin:    General: Skin is warm and dry.  Neurological:     Mental Status: She is alert and oriented to person, place, and time.     ED Results / Procedures / Treatments   Labs (all labs ordered are listed, but only abnormal results are displayed) Labs Reviewed  PREGNANCY, URINE - Abnormal; Notable for the following components:      Result Value   Preg Test, Ur POSITIVE (*)    All other components within normal limits  POC URINE PREG, ED    EKG None  Radiology No results found.  Procedures Procedures    Medications Ordered in ED Medications  ondansetron (ZOFRAN-ODT) disintegrating tablet 4 mg (4 mg Oral Given 09/17/23 2147)    ED Course/ Medical Decision Making/ A&P Clinical Course as of 09/17/23 2209  Mon Sep 17, 2023  2207 Patient to ED with N, V x 2 days. +preg test at home. Given Zofran here with relief. No pain, bleeding. +preg test in ED. Discussed Rx for zofran, prenatal care, MAU as resource.  [SU]    Clinical Course User Index [SU]  Elpidio Anis, PA-C                                 Medical Decision Making Amount and/or Complexity of Data Reviewed Labs: ordered.  Risk Prescription drug management.           Final Clinical Impression(s) / ED Diagnoses Final diagnoses:  Less than [redacted] weeks gestation of pregnancy    Rx / DC Orders ED Discharge Orders          Ordered    ondansetron (ZOFRAN) 4 MG tablet  Every 6 hours        09/17/23 2208              Elpidio Anis, PA-C 09/17/23 2210    Tegeler, Canary Brim, MD 09/17/23 [phone]

## 2023-09-18 ENCOUNTER — Encounter: Payer: Self-pay | Admitting: Physician Assistant

## 2023-09-18 DIAGNOSIS — Z3A01 Less than 8 weeks gestation of pregnancy: Secondary | ICD-10-CM

## 2023-09-21 ENCOUNTER — Ambulatory Visit: Payer: BC Managed Care – PPO | Admitting: Physician Assistant

## 2023-09-24 MED ORDER — ONDANSETRON 8 MG PO TBDP
8.0000 mg | ORAL_TABLET | Freq: Three times a day (TID) | ORAL | 0 refills | Status: DC | PRN
Start: 2023-09-24 — End: 2023-09-30

## 2023-09-24 NOTE — Addendum Note (Signed)
Addended by: Jomarie Longs on: 09/24/2023 08:19 AM   Modules accepted: Orders

## 2023-09-29 ENCOUNTER — Telehealth: Payer: BC Managed Care – PPO | Admitting: Physician Assistant

## 2023-09-29 ENCOUNTER — Encounter: Payer: Self-pay | Admitting: Physician Assistant

## 2023-09-29 DIAGNOSIS — Z5321 Procedure and treatment not carried out due to patient leaving prior to being seen by health care provider: Secondary | ICD-10-CM

## 2023-09-29 DIAGNOSIS — Z91199 Patient's noncompliance with other medical treatment and regimen due to unspecified reason: Secondary | ICD-10-CM

## 2023-09-29 NOTE — Progress Notes (Signed)
Patient no-showed today's appointment;  No charges submitted. Marland Kitchen

## 2023-09-30 ENCOUNTER — Telehealth: Payer: BC Managed Care – PPO

## 2023-09-30 DIAGNOSIS — J209 Acute bronchitis, unspecified: Secondary | ICD-10-CM | POA: Diagnosis not present

## 2023-09-30 DIAGNOSIS — Z3A08 8 weeks gestation of pregnancy: Secondary | ICD-10-CM

## 2023-09-30 MED ORDER — CETIRIZINE HCL 10 MG PO TABS: 10.0000 mg | ORAL_TABLET | Freq: Every day | ORAL | 1 refills | Status: DC

## 2023-09-30 NOTE — Progress Notes (Signed)
Virtual Visit Consent   Carrie Spears, you are scheduled for a virtual visit with a [facility] provider today. Just as with appointments in the office, your consent must be obtained to participate. Your consent will be active for this visit and any virtual visit you may have with one of our providers in the next 365 days. If you have a MyChart account, a copy of this consent can be sent to you electronically.  As this is a virtual visit, video technology does not allow for your provider to perform a traditional examination. This may limit your provider's ability to fully assess your condition. If your provider identifies any concerns that need to be evaluated in person or the need to arrange testing (such as labs, EKG, etc.), we will make arrangements to do so. Although advances in technology are sophisticated, we cannot ensure that it will always work on either your end or our end. If the connection with a video visit is poor, the visit may have to be switched to a telephone visit. With either a video or telephone visit, we are not always able to ensure that we have a secure connection.  By engaging in this virtual visit, you consent to the provision of healthcare and authorize for your insurance to be billed (if applicable) for the services provided during this visit. Depending on your insurance coverage, you may receive a charge related to this service.  I need to obtain your verbal consent now. Are you willing to proceed with your visit today? [name] has provided verbal consent on 09/30/2023 for a virtual visit (video or telephone). [name], FNP  Date: 09/30/2023 8:49 AM  Virtual Visit via Video Note   I, [name], connected with  Carrie Spears  ([id number], 05-29-2001) on 09/30/23 at  8:30 AM EST by a video-enabled telemedicine application and verified that I am speaking with the correct person using two identifiers.  Location: Patient: Virtual Visit Location Patient:  Home Provider: Virtual Visit Location Provider: Home Office   I discussed the limitations of evaluation and management by telemedicine and the availability of in person appointments. The patient expressed understanding and agreed to proceed.    History of Present Illness: Carrie Spears is a 22 y.o. who identifies as a female who was assigned female at birth, and is being seen today for cough and congestion two or three days ago. She is [redacted] weeks pregnant.   HPI: Cough This is a new problem. The current episode started in the past 7 days. The problem has been waxing and waning. The cough is Productive of purulent sputum. Associated symptoms include shortness of breath (improving). Pertinent negatives include no chills, ear congestion, ear pain, fever, headaches, nasal congestion or wheezing. She has tried rest and OTC cough suppressant for the symptoms. The treatment provided mild relief.    Problems:  Patient Active Problem List   Diagnosis Date Noted   Urinary frequency 06/20/2023   Secondary amenorrhea 06/26/2022   Bilateral flank pain 06/26/2022   Depression, recurrent (HCC) 08/18/2020   Epigastric abdominal pain 01/26/2020   Nausea and vomiting 01/26/2020   Diarrhea 01/26/2020   Pregestational diabetes mellitus, modified White class F (HCC) 01/26/2020   Cervicitis and endocervicitis 08/08/2019   Adnexal tenderness 08/08/2019   No energy 01/14/2019   DUB (dysfunctional uterine bleeding) 07/08/2018   Palpitations 07/08/2018   Iron deficiency anemia 07/08/2018   Tenderness of neck 12/09/2017   Reactive airway disease 12/15/2016   Vitamin D deficiency 02/08/2016  Elevated WBC count 02/08/2016   Hypothyroidism 02/08/2016   Class 3 severe obesity due to excess calories without serious comorbidity with body mass index (BMI) of 40.0 to 44.9 in adult [facility]) 02/02/2016   ADD (attention deficit disorder) 02/02/2016   Other fatigue 02/02/2016   Thyromegaly 02/02/2016   Dysuria 02/02/2016    Pre-diabetes 12/28/2014   Mood swings 12/28/2014   ADHD (attention deficit hyperactivity disorder), combined type 09/10/2013    Allergies: No Known Allergies Medications:  Current Outpatient Medications:    cetirizine (ZYRTEC ALLERGY) 10 MG tablet, Take 1 tablet (10 mg total) by mouth daily., Disp: 90 tablet, Rfl: 1   metFORMIN (GLUCOPHAGE) 500 MG tablet, Take 1 tablet (500 mg total) by mouth 2 (two) times daily with a meal., Disp: 180 tablet, Rfl: 1   naproxen (NAPROSYN) 500 MG tablet, TAKE 1 TABLET BY MOUTH 2 TIMES DAILY WITH A MEAL., Disp: 60 tablet, Rfl: 1   ondansetron (ZOFRAN) 4 MG tablet, Take 1 tablet (4 mg total) by mouth every 6 (six) hours., Disp: 15 tablet, Rfl: 0   sertraline (ZOLOFT) 100 MG tablet, Take one tablet daily., Disp: 90 tablet, Rfl: 0   valACYclovir (VALTREX) 500 MG tablet, Take 1 tablet (500 mg total) by mouth daily., Disp: 90 tablet, Rfl: 3  Observations/Objective: Patient is well-developed, well-nourished in no acute distress.  Resting comfortably  at home.  Head is normocephalic, atraumatic.  No labored breathing.  Speech is clear and coherent with logical content.  Patient is alert and oriented at baseline.  Nasal congestion, slightly hoarse voice  Assessment and Plan: 1. Acute bronchitis, unspecified organism (Primary) - cetirizine (ZYRTEC ALLERGY) 10 MG tablet; Take 1 tablet (10 mg total) by mouth daily.  Dispense: 90 tablet; Refill: 1  2. [redacted] weeks gestation of pregnancy  - Take meds as prescribed - Use a cool mist humidifier  -Use saline nose sprays frequently -Force fluids -For any cough or congestion  Use plain Mucinex- regular strength or max strength is fine -For fever or aces or pains- take tylenol or ibuprofen. -Throat lozenges if help -Follow up if symptoms worsen or do not improve     Follow Up Instructions: I discussed the assessment and treatment plan with the patient. The patient was provided an opportunity to ask questions and  all were answered. The patient agreed with the plan and demonstrated an understanding of the instructions.  A copy of instructions were sent to the patient via MyChart unless otherwise noted below.    The patient was advised to call back or seek an in-person evaluation if the symptoms worsen or if the condition fails to improve as anticipated.    [name], FNP

## 2023-09-30 NOTE — Patient Instructions (Signed)

## 2023-10-03 ENCOUNTER — Telehealth: Payer: BC Managed Care – PPO | Admitting: Physician Assistant

## 2023-10-03 ENCOUNTER — Encounter: Payer: Self-pay | Admitting: Physician Assistant

## 2023-10-03 VITALS — Temp 98.7°F | Ht 66.0 in | Wt 163.0 lb

## 2023-10-03 DIAGNOSIS — J329 Chronic sinusitis, unspecified: Secondary | ICD-10-CM | POA: Diagnosis not present

## 2023-10-03 DIAGNOSIS — J40 Bronchitis, not specified as acute or chronic: Secondary | ICD-10-CM | POA: Diagnosis not present

## 2023-10-03 DIAGNOSIS — Z3A09 9 weeks gestation of pregnancy: Secondary | ICD-10-CM | POA: Diagnosis not present

## 2023-10-03 MED ORDER — AMOXICILLIN-POT CLAVULANATE 875-125 MG PO TABS: 1.0000 | ORAL_TABLET | Freq: Two times a day (BID) | ORAL | 0 refills | Status: DC

## 2023-10-03 NOTE — Progress Notes (Signed)
..  Virtual Visit via Video Note  I connected with Carrie Spears on 10/03/23 at  8:10 AM EST by a video enabled telemedicine application and verified that I am speaking with the correct person using two identifiers.  Location: Patient: home Provider: clinic  .Marland KitchenParticipating in visit:  Patient: Carrie Spears Provider: Tandy Gaw PA-C   I discussed the limitations of evaluation and management by telemedicine and the availability of in person appointments. The patient expressed understanding and agreed to proceed.  History of Present Illness: Pt is a 22 yo female [redacted] weeks pregnant who calls into the clinic with over a week of sinus and bronchitis symptoms. She was seen virtually on 12/15 and given symptomatic care. She has not had any improvement and feels like symptoms are worsening. She is using OTC mucinex and delsym. Her cough is productive with green/yellow sputum. She is blowing green/yellow sputum out of nose as well. She has lots of sinus pressure and feels generally fatigued. She denies any problems breathing but her chest does feel "tight".   First OB appt scheduled for Jan 16th.      Observations/Objective: No acute distress Normal breathing Productive cough   Assessment and Plan: Marland KitchenMarland KitchenKaselyn was seen today for bronchitis.  Diagnoses and all orders for this visit:  Sinobronchitis -     amoxicillin-clavulanate (AUGMENTIN) 875-125 MG tablet; Take 1 tablet by mouth 2 (two) times daily. For 10 days.  [redacted] weeks gestation of pregnancy   Treated for sinusitis with augmentin Continue symptomatic treatment Discussed not contagious, ok to stay out of work today but ok to go back tomorrow Rest and hydrate Follow up as needed or if symptoms persist or worsen  Pt is not taking zoloft or metformin discussed she could stay on these while pregnant and may be needed. She would like to discuss with OB.     Follow Up Instructions:    I discussed the assessment and treatment plan  with the patient. The patient was provided an opportunity to ask questions and all were answered. The patient agreed with the plan and demonstrated an understanding of the instructions.   The patient was advised to call back or seek an in-person evaluation if the symptoms worsen or if the condition fails to improve as anticipated.   Tandy Gaw, PA-C

## 2023-10-17 NOTE — L&D Delivery Note (Signed)
 [facility]N [facility] Delivery Note  Raylan Troiani is a 23 y.o. G1P0 s/p vag delivery at [redacted]w[redacted]d. She was admitted for IOL due to A1GDM.   ROM: 2h 38m with clear fluid GBS Status: neg Maximum Maternal Temperature: 98.3  Labor Progress: Ms Ishii was admitted on the evening of 04/22/24 for IOL; she went back and forth between Pitocin  and cytotec  prior to foley placement on the evening of 7/9; once that dislodged she had Pitocin  again and then SROM prior to becoming complete and pushing <1hr to vag delivery.  Delivery Date/Time: July 10th, 2025 at 0755 Delivery: Called to room and patient was complete and pushing. Head delivered LOA. No nuchal cord present. Shoulder and body delivered in usual fashion. Infant with spontaneous cry, placed on mother's abdomen, dried and stimulated. Cord clamped x 2 after 1-minute delay, and cut by FOB. Cord blood drawn. Placenta delivered spontaneously with gentle cord traction. Fundus firm with massage and Pitocin . Labia, perineum, vagina, and cervix inspected and found to be intact.   Placenta: spont, intact; to L&D Complications: none Lacerations: none EBL:  Analgesia: epidural  Postpartum Planning [x]  message to sent to schedule follow-up  [x]  FBS 7/11 am  Infant: boy  APGARs 8/9  3350g (7lb 6.2oz)  Suzen JONETTA Gentry, CNM  04/24/2024 8:23 AM

## 2023-10-30 ENCOUNTER — Encounter: Payer: Self-pay | Admitting: *Deleted

## 2023-11-01 ENCOUNTER — Encounter: Payer: Self-pay | Admitting: Obstetrics and Gynecology

## 2023-11-01 ENCOUNTER — Ambulatory Visit: Payer: Medicaid Other | Admitting: Obstetrics and Gynecology

## 2023-11-01 ENCOUNTER — Other Ambulatory Visit: Payer: Medicaid Other

## 2023-11-01 ENCOUNTER — Other Ambulatory Visit (HOSPITAL_COMMUNITY)
Admission: RE | Admit: 2023-11-01 | Discharge: 2023-11-01 | Disposition: A | Discharge status: Home / Self Care | Payer: Medicaid Other | Source: Ambulatory Visit | Attending: Obstetrics and Gynecology | Admitting: Obstetrics and Gynecology

## 2023-11-01 VITALS — BP 119/62 | HR 102 | Wt 267.0 lb

## 2023-11-01 DIAGNOSIS — R7303 Prediabetes: Secondary | ICD-10-CM

## 2023-11-01 DIAGNOSIS — Z3A13 13 weeks gestation of pregnancy: Secondary | ICD-10-CM | POA: Diagnosis not present

## 2023-11-01 DIAGNOSIS — F339 Major depressive disorder, recurrent, unspecified: Secondary | ICD-10-CM

## 2023-11-01 DIAGNOSIS — A749 Chlamydial infection, unspecified: Secondary | ICD-10-CM

## 2023-11-01 DIAGNOSIS — Z1339 Encounter for screening examination for other mental health and behavioral disorders: Secondary | ICD-10-CM | POA: Diagnosis not present

## 2023-11-01 DIAGNOSIS — Z3482 Encounter for supervision of other normal pregnancy, second trimester: Secondary | ICD-10-CM

## 2023-11-01 DIAGNOSIS — Z23 Encounter for immunization: Secondary | ICD-10-CM

## 2023-11-01 DIAGNOSIS — E039 Hypothyroidism, unspecified: Secondary | ICD-10-CM | POA: Diagnosis not present

## 2023-11-01 DIAGNOSIS — R4586 Emotional lability: Secondary | ICD-10-CM

## 2023-11-01 DIAGNOSIS — Z3402 Encounter for supervision of normal first pregnancy, second trimester: Secondary | ICD-10-CM

## 2023-11-01 DIAGNOSIS — O98811 Other maternal infectious and parasitic diseases complicating pregnancy, first trimester: Secondary | ICD-10-CM | POA: Diagnosis not present

## 2023-11-01 DIAGNOSIS — O99341 Other mental disorders complicating pregnancy, first trimester: Secondary | ICD-10-CM

## 2023-11-01 DIAGNOSIS — O99281 Endocrine, nutritional and metabolic diseases complicating pregnancy, first trimester: Secondary | ICD-10-CM

## 2023-11-01 DIAGNOSIS — Z3401 Encounter for supervision of normal first pregnancy, first trimester: Secondary | ICD-10-CM | POA: Diagnosis not present

## 2023-11-01 DIAGNOSIS — Z3490 Encounter for supervision of normal pregnancy, unspecified, unspecified trimester: Secondary | ICD-10-CM | POA: Insufficient documentation

## 2023-11-01 DIAGNOSIS — O0990 Supervision of high risk pregnancy, unspecified, unspecified trimester: Secondary | ICD-10-CM | POA: Insufficient documentation

## 2023-11-01 MED ORDER — SERTRALINE HCL 100 MG PO TABS: ORAL_TABLET | ORAL | 3 refills | Status: DC

## 2023-11-01 MED ORDER — ASPIRIN 81 MG PO TBEC: 81.0000 mg | DELAYED_RELEASE_TABLET | Freq: Every day | ORAL | 3 refills | Status: DC

## 2023-11-01 NOTE — Progress Notes (Signed)
History:   Carrie Spears is a 23 y.o. G1P0 at [redacted]w[redacted]d by early ultrasound being seen today for her first obstetrical visit.   Patient does intend to breast feed.   Patient reports  low back pain .      HISTORY: OB History  Gravida Para Term Preterm AB Living  1 0 0 0 0 0  SAB IAB Ectopic Multiple Live Births  0 0 0 0 0    # Outcome Date GA Lbr Len/2nd Weight Sex Type Anes PTL Lv  1 Current              Past Medical History:  Diagnosis Date   ADHD (attention deficit hyperactivity disorder)    History reviewed. No pertinent surgical history. Family History  Problem Relation Age of Onset   Hypertension Mother    Lupus Maternal Aunt    Diabetes Maternal Aunt    Social History   Tobacco Use   Smoking status: Never   Smokeless tobacco: Never  Vaping Use   Vaping status: Never Used  Substance Use Topics   Alcohol use: No    Alcohol/week: 0.0 standard drinks of alcohol   Drug use: No   No Known Allergies No current outpatient medications on file prior to visit.   No current facility-administered medications on file prior to visit.    Review of Systems Pertinent items noted in HPI and remainder of comprehensive ROS otherwise negative.  Physical Exam:   Vitals:   11/01/23 1045  BP: 119/62  Pulse: (!) 102  Weight: 267 lb (121.1 kg)     Bedside Ultrasound for FHR check: Viable intrauterine pregnancy with positive cardiac activity noted, fetal heart rate 147bpm  Patient informed that the ultrasound is considered a limited obstetric ultrasound and is not intended to be a complete ultrasound exam.  Patient also informed that the ultrasound is not being completed with the intent of assessing for fetal or placental anomalies or any pelvic abnormalities.  Explained that the purpose of today's ultrasound is to assess for fetal heart rate.  Patient acknowledges the purpose of the exam and the limitations of the study.  General: well-developed, well-nourished female in no  acute distress  Breasts:  normal appearance, no masses or tenderness bilaterally  Skin: normal coloration and turgor, no rashes  Neurologic: oriented, normal, negative, normal mood  Extremities: normal strength, tone, and muscle mass, ROM of all joints is normal  HEENT PERRLA, extraocular movement intact and sclera clear, anicteric  Neck supple and no masses  Cardiovascular: regular rate and rhythm  Respiratory:  no respiratory distress, normal breath sounds  Abdomen: soft, non-tender; bowel sounds normal; no masses,  no organomegaly  Pelvic: normal external genitalia, no lesions, normal vaginal mucosa, normal vaginal discharge, normal cervix, pap smear done.     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 Hypothyroidism, unspecified type (Primary) Has not needed meds any time  recently. Will check TSH with routine labs.  - US OB Limited; Future - TSH Rfx on Abnormal to Free T4  2. Pre-diabetes Has stopped MTF as of 12/18. At this point, recommend doing early 2 hr. She will come back and do this fasting. Also offered 1 hr today but she would prefer to come back.  - US OB Limited; Future - Glucose Tolerance, 2 Hours w/1 Hour  3. Encounter for supervision of normal first pregnancy in first trimester Initial labs ordered. Pap done today Flu shot given.  Discussed ACURE4MOMs. Pt given information.  Continue  prenatal vitamins. Problem list reviewed and updated. Genetic Screening discussed, NIPS: ordered. Ultrasound discussed; fetal anatomic survey: ordered. Anticipatory guidance about prenatal visits given including labs, ultrasounds, and testing. Discussed usage of Babyscripts and virtual visits - HgB A1c - Cytology - PAP( [facility]) - Pregnancy, Initial Screen - [facility] Basic Panel  4. Depression, recurrent (HCC) Stopped Zoloft one month ago and has had bad mood swings. She would like to resume it. Will start at 50 mg for two weeks and then go back to prepregnancy dose of 100 mg.   5. Mood swings Accepts IBH referral   The nature of [place] [facility] with multiple MDs and Advanced Practice Providers was explained to patient; also emphasized that residents, students are part of our team. Routine obstetric precautions reviewed. Encouraged to seek out care at office or emergency room [facility] preferred) for urgent and/or emergent concerns. Return in about 4 weeks (around 11/29/2023) for OB VISIT, MD or APP.    Milas Hock, MD, FACOG Obstetrician & Gynecologist, [facility] for [facility], [facility]

## 2023-11-01 NOTE — Patient Instructions (Signed)
Our practice his participating in a study that provides no-cost doula care. ACURE4Moms is a study looking at how doula care can reduce birthing disparities for Black and brown birthing people. We like to refer patients as soon as possible, but definitely before 28 weeks so patients can get to know their doula.    A doula is trained to provide support before, during and just after you give birth. While doulas do not provide medical care, they do provide emotional, physical and educational support. Doulas can help reduce your stress and comfort you and your partner. They can help you cope with labor by helping you use breathing techniques, massage, creative labor positioning, essential oils and affirmations.   ACURE4Moms is a research study trying to reduce:   low birthweight babies  emergency department visits & hospitalizations for birthing persons and their babies  depression among birthing people  discrimination in pregnancy-related care ACURE4Moms is trying out 2 programs designed by  people who have given birth. These programs include: 1. Sharing patient data and warning alerts with clinic staff to keep them accountable for their patients' outcomes and providing tools to help them  reduce bias in care. 2. Matching eligible patients with doulas from the  same community as the patients.  If you would like to participate in this study, please visit:   [email or web address]

## 2023-11-02 LAB — TSH RFX ON ABNORMAL TO FREE T4: TSH: 2.46 u[IU]/mL (ref 0.450–4.500)

## 2023-11-06 ENCOUNTER — Encounter: Payer: Self-pay | Admitting: Obstetrics and Gynecology

## 2023-11-06 DIAGNOSIS — A749 Chlamydial infection, unspecified: Secondary | ICD-10-CM | POA: Insufficient documentation

## 2023-11-06 LAB — CYTOLOGY - PAP
Chlamydia: POSITIVE — AB
Comment: NEGATIVE
Comment: NORMAL
Diagnosis: NEGATIVE
Neisseria Gonorrhea: NEGATIVE

## 2023-11-06 LAB — PREGNANCY, INITIAL SCREEN
Antibody Screen: NEGATIVE
Basophils Absolute: 0.1 10*3/uL (ref 0.0–0.2)
Basos: 1 %
Bilirubin, UA: NEGATIVE
Chlamydia trachomatis, NAA: POSITIVE — AB
EOS (ABSOLUTE): 0.3 10*3/uL (ref 0.0–0.4)
Eos: 2 %
Glucose, UA: NEGATIVE
HCV Ab: NONREACTIVE
HIV Screen 4th Generation wRfx: NONREACTIVE
Hematocrit: 36.3 % (ref 34.0–46.6)
Hemoglobin: 11.6 g/dL (ref 11.1–15.9)
Hepatitis B Surface Ag: NEGATIVE
Immature Grans (Abs): 0.1 10*3/uL (ref 0.0–0.1)
Immature Granulocytes: 1 %
Ketones, UA: NEGATIVE
Lymphocytes Absolute: 3.3 10*3/uL — ABNORMAL HIGH (ref 0.7–3.1)
Lymphs: 23 %
MCH: 28.9 pg (ref 26.6–33.0)
MCHC: 32 g/dL (ref 31.5–35.7)
MCV: 90 fL (ref 79–97)
Monocytes Absolute: 0.4 10*3/uL (ref 0.1–0.9)
Monocytes: 3 %
Neisseria Gonorrhoeae by PCR: NEGATIVE
Neutrophils Absolute: 9.9 10*3/uL — ABNORMAL HIGH (ref 1.4–7.0)
Neutrophils: 70 %
Nitrite, UA: NEGATIVE
Platelets: 340 10*3/uL (ref 150–450)
RBC: 4.02 x10E6/uL (ref 3.77–5.28)
RDW: 11.7 % (ref 11.7–15.4)
RPR Ser Ql: NONREACTIVE
Rh Factor: POSITIVE
Rubella Antibodies, IGG: 6.73 {index} (ref >0.99)
Specific Gravity, UA: 1.025 (ref 1.005–1.030)
Urobilinogen, Ur: 0.2 mg/dL (ref 0.2–1.0)
WBC: 14.1 10*3/uL — ABNORMAL HIGH (ref 3.4–10.8)
pH, UA: 6 (ref 5.0–7.5)

## 2023-11-06 LAB — HEMOGLOBIN A1C
Est. average glucose Bld gHb Est-mCnc: 114 mg/dL
Hgb A1c MFr Bld: 5.6 % (ref 4.8–5.6)

## 2023-11-06 LAB — HCV INTERPRETATION

## 2023-11-06 LAB — MICROSCOPIC EXAMINATION
Casts: NONE SEEN /[LPF]
Epithelial Cells (non renal): >10 /[HPF] — AB (ref 0–10)
WBC, UA: >30 /[HPF] — AB (ref 0–5)

## 2023-11-06 LAB — URINE CULTURE, OB REFLEX

## 2023-11-06 MED ORDER — AZITHROMYCIN 250 MG PO TABS: 1000.0000 mg | ORAL_TABLET | Freq: Once | ORAL | 1 refills | Status: AC

## 2023-11-06 NOTE — Addendum Note (Signed)
Addended by: Milas Hock A on: 11/06/2023 12:34 PM   Modules accepted: Orders

## 2023-11-08 ENCOUNTER — Encounter: Payer: Self-pay | Admitting: Obstetrics and Gynecology

## 2023-11-08 ENCOUNTER — Telehealth: Payer: Self-pay | Admitting: Licensed Clinical Social Worker

## 2023-11-08 NOTE — Telephone Encounter (Signed)
 [facility] contacted patient on this date to provide [facility] intro and to schedule appointment.[facility] was unable to leave a VM.

## 2023-11-16 ENCOUNTER — Encounter: Payer: Self-pay | Admitting: Obstetrics and Gynecology

## 2023-11-22 DIAGNOSIS — Z3402 Encounter for supervision of normal first pregnancy, second trimester: Secondary | ICD-10-CM | POA: Diagnosis not present

## 2023-11-26 NOTE — Progress Notes (Signed)
PRENATAL VISIT NOTE  Subjective:  Carrie Spears is a 23 y.o. G1P0 at [redacted]w[redacted]d being seen today for ongoing prenatal care.  She is currently monitored for the following issues for this high-risk pregnancy and has Pre-diabetes; Class 3 severe obesity due to excess calories without serious comorbidity with body mass index (BMI) of 40.0 to 44.9 in adult [facility]); ADD (attention deficit disorder); Thyromegaly; Hypothyroidism; Reactive airway disease; No energy; Pregestational diabetes mellitus, modified White class F (HCC); Depression, recurrent (HCC); Urinary frequency; Supervision of normal pregnancy; and Chlamydia infection affecting pregnancy in first trimester on their problem list.  Patient reports  worsening mood symptoms.  She has not started the Zoloft that was prescribed because she felt funny when she took it.  She feels super worried/anxious about taking it when someone is at home to monitor her.  She reports suicidal ideation that is passive, denies intent/plan.  She also notes frequent morning headaches.  She is taking Tylenol PM to sleep. .  Contractions: Not present. Vag. Bleeding: None.  Movement: Absent. Denies leaking of fluid.   The following portions of the patient's history were reviewed and updated as appropriate: allergies, current medications, past family history, past medical history, past social history, past surgical history and problem list.   Objective:   Vitals:   11/29/23 0859  BP: 117/77  Pulse: 94  Weight: 275 lb (124.7 kg)    Fetal Status: Fetal Heart Rate (bpm): 149   Movement: Absent     General:  Alert, oriented and cooperative. Patient is in no acute distress.  Skin: Skin is warm and dry. No rash noted.   Cardiovascular: Normal heart rate noted  Respiratory: Normal respiratory effort, no problems with respiration noted  Abdomen: Soft, gravid, appropriate for gestational age.  Pain/Pressure: Absent      Assessment and Plan:  Pregnancy: G1P0 at [redacted]w[redacted]d 1.  Encounter for supervision of normal first pregnancy in second trimester (Primary) 2. [redacted] weeks gestation of pregnancy Anatomy scheduled 3/5 AFP discussed and ordered.  She will collect it with her 2-hour GTT next week  3. History of prediabetes A1c 5.6 Early 2h GTT planned for today, but the patient ate prior to her appointment.  She will reschedule lab visit for next week and AFP will also be collected at that time.  4. Depression, recurrent (HCC) 5. Attention deficit disorder, unspecified type - Zoloft 100mg  daily ordered at NOB, pt felt weird after taking the first few doses and stopped.  We discussed alternative SSRI versus starting an even lower dose to see if she is able to tolerate it.  She would like to try lower dose.  Prescription sent for Zoloft 25 mg daily x 7 days then 50 mg daily afterwards - IBH referral placed last visit, pt did not realize that a MyChart message had been sent and she will call provider back today -Discussed presenting for emergency care if SI becomes active, more frequent, etc.  Reviewed resources available.  Patient is present with her mom who also has providing support and encouraging patient to reach out as needed.  6. Chlamydia infection affecting pregnancy in first trimester Reports completion of treatment for her & her partner Will get TOC next appt  7. Hypothyroidism, unspecified type TSH 2.46 on 1/16 No current meds  8. Headaches Discussed stopping Tylenol PM in case this is rebound headache. Reviewed Unisom at night can help with nausea and sleep Reviewed option for small amounts of caffeine in the morning to see if that  also helps her headaches  Please refer to After Visit Summary for other counseling recommendations.   Future Appointments  Date Time Provider Department Center  12/19/2023  9:15 AM [facility] NURSE [facility]  12/19/2023  9:30 AM [facility] US2 [facility]  12/27/2023  9:50 AM Milas Hock, MD [facility]    Lennart Pall, MD

## 2023-11-28 LAB — PANORAMA PRENATAL TEST FULL PANEL:PANORAMA TEST PLUS 5 ADDITIONAL MICRODELETIONS: FETAL FRACTION: 7.9

## 2023-11-29 ENCOUNTER — Ambulatory Visit (INDEPENDENT_AMBULATORY_CARE_PROVIDER_SITE_OTHER): Payer: Medicaid Other | Admitting: Obstetrics and Gynecology

## 2023-11-29 VITALS — BP 117/77 | HR 94 | Wt 275.0 lb

## 2023-11-29 DIAGNOSIS — F339 Major depressive disorder, recurrent, unspecified: Secondary | ICD-10-CM

## 2023-11-29 DIAGNOSIS — O98811 Other maternal infectious and parasitic diseases complicating pregnancy, first trimester: Secondary | ICD-10-CM

## 2023-11-29 DIAGNOSIS — E039 Hypothyroidism, unspecified: Secondary | ICD-10-CM

## 2023-11-29 DIAGNOSIS — Z3402 Encounter for supervision of normal first pregnancy, second trimester: Secondary | ICD-10-CM

## 2023-11-29 DIAGNOSIS — R7303 Prediabetes: Secondary | ICD-10-CM | POA: Diagnosis not present

## 2023-11-29 DIAGNOSIS — F988 Other specified behavioral and emotional disorders with onset usually occurring in childhood and adolescence: Secondary | ICD-10-CM | POA: Diagnosis not present

## 2023-11-29 DIAGNOSIS — A749 Chlamydial infection, unspecified: Secondary | ICD-10-CM

## 2023-11-29 DIAGNOSIS — Z3A17 17 weeks gestation of pregnancy: Secondary | ICD-10-CM

## 2023-11-29 MED ORDER — SERTRALINE HCL 50 MG PO TABS: ORAL_TABLET | ORAL | 11 refills | Status: DC

## 2023-11-29 NOTE — Progress Notes (Signed)
Pt is not doing early GTT today

## 2023-11-29 NOTE — Patient Instructions (Signed)
Safe Medications in Pregnancy   Acne:  Benzoyl Peroxide  Salicylic Acid   Backache/Headache:  Tylenol: 2 regular strength every 4 hours OR               2 Extra strength every 6 hours   Colds/Coughs/Allergies:  Benadryl (alcohol free) 25 mg every 6 hours as needed  Breath right strips  Claritin  Cepacol throat lozenges  Chloraseptic throat spray  Cold-Eeze- up to three times per day  Cough drops, alcohol free  Flonase (by prescription only)  Guaifenesin  Mucinex  Robitussin DM (plain only, alcohol free)  Saline nasal spray/drops  Sudafed (pseudoephedrine) & Actifed * use only after [redacted] weeks gestation and if you do not have high blood pressure  Tylenol  Vicks Vaporub  Zinc lozenges  Zyrtec   Constipation:  Colace  Ducolax suppositories  Fleet enema  Glycerin suppositories  Metamucil  Milk of magnesia  Miralax  Senokot  Smooth move tea   Diarrhea:  Kaopectate  Imodium A-D   *NO pepto Bismol   Hemorrhoids:  Anusol  Anusol HC  Preparation H  Tucks   Indigestion:  Tums  Maalox  Mylanta  Zantac  Pepcid   Insomnia:  Benadryl (alcohol free) 25mg  every 6 hours as needed  Unisom, no Gelcaps   Leg Cramps:  Tums  MagGel   Nausea/Vomiting:  Bonine  Dramamine  Emetrol  Ginger extract  Sea bands  Meclizine  Nausea medication to take during pregnancy:  Unisom (doxylamine succinate 25 mg tablets) Take one tablet daily at bedtime. If symptoms are not adequately controlled, the dose can be increased to a maximum recommended dose of two tablets daily (1/2 tablet in the morning, 1/2 tablet mid-afternoon and one at bedtime).  Vitamin B6 100mg  tablets. Take one tablet twice a day (up to 200 mg per day).   Skin Rashes:  Aveeno products  Benadryl cream or 25mg  every 6 hours as needed  Calamine Lotion  1% cortisone cream   Yeast infection:  Gyne-lotrimin 7  Monistat 7    **If taking multiple medications, please check labels to avoid duplicating the same  active ingredients  **take medication as directed on the label  ** Do not exceed 4000 mg of tylenol in 24 hours  **Do not take medications that contain aspirin or ibuprofen

## 2023-11-30 ENCOUNTER — Encounter: Payer: Self-pay | Admitting: Obstetrics and Gynecology

## 2023-12-05 LAB — HORIZON CUSTOM: REPORT SUMMARY: NEGATIVE

## 2023-12-13 ENCOUNTER — Encounter: Payer: Self-pay | Admitting: Obstetrics and Gynecology

## 2023-12-19 ENCOUNTER — Other Ambulatory Visit: Payer: Self-pay

## 2023-12-19 ENCOUNTER — Other Ambulatory Visit: Payer: Self-pay | Admitting: *Deleted

## 2023-12-19 ENCOUNTER — Ambulatory Visit (HOSPITAL_BASED_OUTPATIENT_CLINIC_OR_DEPARTMENT_OTHER)

## 2023-12-19 ENCOUNTER — Ambulatory Visit: Payer: Medicaid Other | Admitting: *Deleted

## 2023-12-19 ENCOUNTER — Ambulatory Visit: Payer: Medicaid Other | Attending: Obstetrics and Gynecology

## 2023-12-19 VITALS — BP 114/69

## 2023-12-19 VITALS — BP 139/71 | HR 101

## 2023-12-19 DIAGNOSIS — Z6841 Body Mass Index (BMI) 40.0 and over, adult: Secondary | ICD-10-CM | POA: Insufficient documentation

## 2023-12-19 DIAGNOSIS — O99212 Obesity complicating pregnancy, second trimester: Secondary | ICD-10-CM | POA: Diagnosis not present

## 2023-12-19 DIAGNOSIS — R7303 Prediabetes: Secondary | ICD-10-CM

## 2023-12-19 DIAGNOSIS — O0990 Supervision of high risk pregnancy, unspecified, unspecified trimester: Secondary | ICD-10-CM

## 2023-12-19 DIAGNOSIS — Z3401 Encounter for supervision of normal first pregnancy, first trimester: Secondary | ICD-10-CM | POA: Diagnosis not present

## 2023-12-19 DIAGNOSIS — E66813 Obesity, class 3: Secondary | ICD-10-CM | POA: Insufficient documentation

## 2023-12-19 DIAGNOSIS — O321XX0 Maternal care for breech presentation, not applicable or unspecified: Secondary | ICD-10-CM | POA: Insufficient documentation

## 2023-12-19 DIAGNOSIS — Z3A20 20 weeks gestation of pregnancy: Secondary | ICD-10-CM | POA: Diagnosis not present

## 2023-12-19 DIAGNOSIS — Z363 Encounter for antenatal screening for malformations: Secondary | ICD-10-CM | POA: Insufficient documentation

## 2023-12-19 DIAGNOSIS — Z362 Encounter for other antenatal screening follow-up: Secondary | ICD-10-CM

## 2023-12-19 NOTE — Progress Notes (Signed)
 Patient information  Patient Name: Carrie Spears  Patient MRN:   [id number]  Referring practice: [facility] Referring Provider: [place] [facility] [place]  MFM CONSULT  Carrie Spears is a 23 y.o. G1P0 at [redacted]w[redacted]d here for ultrasound and consultation. Patient Active Problem List   Diagnosis Date Noted   Chlamydia infection affecting pregnancy in first trimester 11/06/2023   Supervision of high risk pregnancy, antepartum 11/01/2023   Depression, recurrent (HCC) 08/18/2020   Class 3 severe obesity due to excess calories without serious comorbidity with body mass index (BMI) of 40.0 to 44.9 in adult [facility]) 02/02/2016   Pre-diabetes 12/28/2014    Carrie Spears is doing well today with no acute concerns.    RE obesity in pregnancy: I discussed the potential complications associated with obesity in pregnancy.  These complications include but are not limited to increased risk of excessive maternal weight gain, fetal growth abnormalities, fetal congenital disorders, inability to visualize fetal anatomic structures on ultrasound, gestational diabetes, hypertensive disorders of pregnancy, operative birth including cesarean delivery or assisted vaginal delivery, delayed wound healing and many long-term health complications.  I discussed the need for continued growth ultrasounds and possibly antenatal testing depending upon how the pregnancy course progresses.  Maternal weight gain should be limited to 10 to 20 pounds during the pregnancy.  While normal weight loss may occur during the first and early second trimester, efforts to actively lose weight with the use of medication is not recommended during pregnancy.  A whole food diet and regular exercise of at least 15 to 30 minutes of moderately strenuous activity is recommended in the absence of any contraindications. Weight loss with the use of medications is not recommended during pregnancy.  I discussed the risk and impact of preeclampsia on her pregnancy  and the role of baseline laboratory assessment of kidney, liver and platelet count as well as the role of low dose Aspirin to the reduce the risk of developing preeclampsia. I reassured the patient that we expect a favorable pregnancy outcome but due to her pregnancy complications she will need a higher level of monitoring for her pregnancy compared to a pregnancy without complications. The patient had time to ask questions that were answered to her satisfaction. She verbalized understanding of our discussion and request to proceed with the plan outlined in the recommendations.   Sonographic findings Single intrauterine pregnancy at 20w 0d  Fetal cardiac activity:  Observed and appears normal. Presentation: Breech. The anatomic structures that were well seen appear normal without evidence of soft markers. Due to poor acoustic windows some structures remain suboptimally visualized. Fetal biometry shows the estimated fetal weight at the 65 percentile.  Amniotic fluid: Within normal limits.  Placenta: Anterior. Adnexa: No abnormality visualized. Cervical length: 4.3 cm.  There are limitations of prenatal ultrasound such as the inability to detect certain abnormalities due to poor visualization. Various factors such as fetal position, gestational age and maternal body habitus may increase the difficulty in visualizing the fetal anatomy.    Recommendations - EDD should be 05/07/2024 based on  LMP  (08/01/23). - Follow up ultrasound in 4-6 weeks to attempt visualization of the anatomy not seen and reassess the fetal growth -Baseline preeclampsia labs: CMP, CBC and urine protein/creatinine ratio if not previously completed.  -Early glucose screening due to multiple risk factors. -Continue Aspirin 81 mg for preeclampsia prophylaxis -Serial growth ultrasounds starting around 28 weeks to monitor for fetal growth restriction -Antenatal testing to start around 34 weeks due to the  increased risk of  stillbirth and high risk pregnancy -Delivery timing pending clinical course but likely around 39-[redacted] weeks gestation -Continue routine prenatal care with referring OB provider  Review of Systems: A review of systems was performed and was negative except per HPI   Vitals and Physical Exam    12/19/2023   10:25 AM 12/19/2023    9:13 AM 11/29/2023    8:59 AM  Vitals with BMI  Weight   275 lbs  Systolic 114 139 161  Diastolic 69 71 77  Pulse  101 94    Sitting comfortably on the sonogram table Nonlabored breathing Normal rate and rhythm Abdomen is nontender  Past pregnancies OB History  Gravida Para Term Preterm AB Living  1       SAB IAB Ectopic Multiple Live Births          # Outcome Date GA Lbr Len/2nd Weight Sex Type Anes PTL Lv  1 Current              I spent 15 minutes reviewing the patients chart, including labs and images as well as counseling the patient about her medical conditions. Greater than 50% of the time was spent in direct face-to-face patient counseling.  Braxton Feathers, DO Maternal fetal medicine, [place]   12/19/2023  10:26 AM

## 2023-12-26 NOTE — Progress Notes (Unsigned)
   PRENATAL VISIT NOTE  Subjective:  Carrie Spears is a 23 y.o. G1P0 at [redacted]w[redacted]d being seen today for ongoing prenatal care.  She is currently monitored for the following issues for this high-risk pregnancy and has Pre-diabetes; Class 3 severe obesity due to excess calories without serious comorbidity with body mass index (BMI) of 40.0 to 44.9 in adult [facility]); Depression, recurrent (HCC); Supervision of high risk pregnancy, antepartum; and Chlamydia infection affecting pregnancy in first trimester on their problem list.  Patient reports {sx:14538}.   .  .   . Denies leaking of fluid.   The following portions of the patient's history were reviewed and updated as appropriate: allergies, current medications, past family history, past medical history, past social history, past surgical history and problem list.   Objective:  There were no vitals filed for this visit.  Fetal Status:           General:  Alert, oriented and cooperative. Patient is in no acute distress.  Skin: Skin is warm and dry. No rash noted.   Cardiovascular: Normal heart rate noted  Respiratory: Normal respiratory effort, no problems with respiration noted  Abdomen: Soft, gravid, appropriate for gestational age.        Pelvic: Cervical exam deferred        Extremities: Normal range of motion.     Mental Status: Normal mood and affect. Normal behavior. Normal judgment and thought content.   Assessment and Plan:  Pregnancy: G1P0 at [redacted]w[redacted]d 1. Supervision of high risk pregnancy, antepartum (Primary) AFP not yet drawn. Will go today *** Anatomy incomplete and has f/u scheduled  2. Chlamydia infection affecting pregnancy in first trimester TOC today  3. Pre-diabetes Needs 2 hr - plans to go ***  4. Depression, recurrent (HCC) Started on lower Zoloft dose - it is *** [facility] referral sent previously and she was going to f/u but no appts yet made. Discussed with pt - ***  5. Pregnancy with 21 completed weeks  gestation   Preterm labor symptoms and general obstetric precautions including but not limited to vaginal bleeding, contractions, leaking of fluid and fetal movement were reviewed in detail with the patient. Please refer to After Visit Summary for other counseling recommendations.   No follow-ups on file.  Future Appointments  Date Time Provider Department Center  12/27/2023  9:50 AM Milas Hock, MD [facility]  01/16/2024 10:15 AM [facility] NURSE [facility]  01/16/2024 10:30 AM [facility] US4 [facility]    Milas Hock, MD

## 2023-12-27 ENCOUNTER — Other Ambulatory Visit (HOSPITAL_COMMUNITY)
Admission: RE | Admit: 2023-12-27 | Discharge: 2023-12-27 | Disposition: A | Discharge status: Home / Self Care | Source: Ambulatory Visit | Attending: Obstetrics and Gynecology | Admitting: Obstetrics and Gynecology

## 2023-12-27 ENCOUNTER — Encounter: Payer: Self-pay | Admitting: Obstetrics and Gynecology

## 2023-12-27 ENCOUNTER — Ambulatory Visit (INDEPENDENT_AMBULATORY_CARE_PROVIDER_SITE_OTHER): Payer: Medicaid Other | Admitting: Obstetrics and Gynecology

## 2023-12-27 VITALS — BP 122/78 | HR 86 | Wt 284.0 lb

## 2023-12-27 DIAGNOSIS — O0990 Supervision of high risk pregnancy, unspecified, unspecified trimester: Secondary | ICD-10-CM | POA: Insufficient documentation

## 2023-12-27 DIAGNOSIS — R7303 Prediabetes: Secondary | ICD-10-CM | POA: Diagnosis not present

## 2023-12-27 DIAGNOSIS — Z3A21 21 weeks gestation of pregnancy: Secondary | ICD-10-CM | POA: Diagnosis not present

## 2023-12-27 DIAGNOSIS — O98811 Other maternal infectious and parasitic diseases complicating pregnancy, first trimester: Secondary | ICD-10-CM | POA: Insufficient documentation

## 2023-12-27 DIAGNOSIS — F339 Major depressive disorder, recurrent, unspecified: Secondary | ICD-10-CM

## 2023-12-27 DIAGNOSIS — A749 Chlamydial infection, unspecified: Secondary | ICD-10-CM | POA: Insufficient documentation

## 2023-12-28 LAB — CERVICOVAGINAL ANCILLARY ONLY
Chlamydia: NEGATIVE
Comment: NEGATIVE
Comment: NORMAL
Neisseria Gonorrhea: NEGATIVE

## 2024-01-03 ENCOUNTER — Ambulatory Visit: Admitting: *Deleted

## 2024-01-03 DIAGNOSIS — Z3A28 28 weeks gestation of pregnancy: Secondary | ICD-10-CM

## 2024-01-03 DIAGNOSIS — O99210 Obesity complicating pregnancy, unspecified trimester: Secondary | ICD-10-CM | POA: Diagnosis not present

## 2024-01-03 DIAGNOSIS — O99213 Obesity complicating pregnancy, third trimester: Secondary | ICD-10-CM

## 2024-01-03 NOTE — Progress Notes (Signed)
 Pt here for early 2 hr GTT only

## 2024-01-04 LAB — GLUCOSE TOLERANCE, 2 HOURS W/ 1HR
Glucose, 1 hour: 132 mg/dL (ref 70–179)
Glucose, 2 hour: 134 mg/dL (ref 70–152)
Glucose, Fasting: 87 mg/dL (ref 70–91)

## 2024-01-05 ENCOUNTER — Encounter: Payer: Self-pay | Admitting: Obstetrics and Gynecology

## 2024-01-16 ENCOUNTER — Ambulatory Visit

## 2024-01-16 ENCOUNTER — Ambulatory Visit: Attending: Maternal & Fetal Medicine

## 2024-01-16 ENCOUNTER — Other Ambulatory Visit: Payer: Self-pay | Admitting: *Deleted

## 2024-01-16 ENCOUNTER — Ambulatory Visit: Admitting: Obstetrics and Gynecology

## 2024-01-16 VITALS — BP 133/67 | HR 91

## 2024-01-16 DIAGNOSIS — O0990 Supervision of high risk pregnancy, unspecified, unspecified trimester: Secondary | ICD-10-CM

## 2024-01-16 DIAGNOSIS — Z362 Encounter for other antenatal screening follow-up: Secondary | ICD-10-CM

## 2024-01-16 DIAGNOSIS — O0992 Supervision of high risk pregnancy, unspecified, second trimester: Secondary | ICD-10-CM | POA: Diagnosis not present

## 2024-01-16 DIAGNOSIS — O99212 Obesity complicating pregnancy, second trimester: Secondary | ICD-10-CM

## 2024-01-16 DIAGNOSIS — E669 Obesity, unspecified: Secondary | ICD-10-CM | POA: Diagnosis not present

## 2024-01-16 DIAGNOSIS — Z3A24 24 weeks gestation of pregnancy: Secondary | ICD-10-CM | POA: Diagnosis not present

## 2024-01-16 NOTE — Progress Notes (Signed)
 After review, MFM consult with provider is not indicated for today  Noralee Space, MD 01/16/2024 4:01 PM  [facility]

## 2024-01-16 NOTE — Progress Notes (Deleted)
   PRENATAL VISIT NOTE  Subjective:  Carrie Spears is a 23 y.o. G1P0 at [redacted]w[redacted]d being seen today for ongoing prenatal care.  She is currently monitored for the following issues for this low-risk pregnancy and has Pre-diabetes; Class 3 severe obesity due to excess calories without serious comorbidity with body mass index (BMI) of 40.0 to 44.9 in adult [facility]); Depression, recurrent (HCC); Supervision of high risk pregnancy, antepartum; and Chlamydia infection affecting pregnancy in first trimester on their problem list.  Patient reports {sx:14538}.   .  .   . Denies leaking of fluid.   The following portions of the patient's history were reviewed and updated as appropriate: allergies, current medications, past family history, past medical history, past social history, past surgical history and problem list.   Objective:  There were no vitals filed for this visit.  Fetal Status:           General:  Alert, oriented and cooperative. Patient is in no acute distress.  Skin: Skin is warm and dry. No rash noted.   Cardiovascular: Normal heart rate noted  Respiratory: Normal respiratory effort, no problems with respiration noted  Abdomen: Soft, gravid, appropriate for gestational age.        Pelvic: Cervical exam deferred        Extremities: Normal range of motion.     Mental Status: Normal mood and affect. Normal behavior. Normal judgment and thought content.   Assessment and Plan:  Pregnancy: G1P0 at [redacted]w[redacted]d 1. Supervision of high risk pregnancy, antepartum (Primary) 28 wk labs next time  2. Chlamydia infection affecting pregnancy in first trimester TOC neg  3. Pre-diabetes 22 wk 2 hr was wnl. Recheck at 28w  4. Pregnancy with 24 completed weeks gestation   Preterm labor symptoms and general obstetric precautions including but not limited to vaginal bleeding, contractions, leaking of fluid and fetal movement were reviewed in detail with the patient. Please refer to After Visit Summary for  other counseling recommendations.   No follow-ups on file.  Future Appointments  Date Time Provider Department Center  01/17/2024 10:30 AM Milas Hock, MD [facility]  02/13/2024  9:00 AM [facility] PROVIDER 1 [facility]  02/13/2024  9:30 AM [facility] US6 [facility]  02/14/2024  8:50 AM Milas Hock, MD [facility]  03/12/2024  9:00 AM [facility] PROVIDER 1 [facility]  03/12/2024  9:30 AM [facility] US5 [facility]    Milas Hock, MD

## 2024-01-17 ENCOUNTER — Encounter: Admitting: Obstetrics and Gynecology

## 2024-01-17 DIAGNOSIS — R7303 Prediabetes: Secondary | ICD-10-CM

## 2024-01-17 DIAGNOSIS — O0990 Supervision of high risk pregnancy, unspecified, unspecified trimester: Secondary | ICD-10-CM

## 2024-01-17 DIAGNOSIS — A749 Chlamydial infection, unspecified: Secondary | ICD-10-CM

## 2024-01-17 DIAGNOSIS — Z3A24 24 weeks gestation of pregnancy: Secondary | ICD-10-CM

## 2024-02-11 NOTE — Progress Notes (Deleted)
   PRENATAL VISIT NOTE  Subjective:  Carrie Spears is a 23 y.o. G1P0 at 28w***d being seen today for ongoing prenatal care.  She is currently monitored for the following issues for this low-risk pregnancy and has Pre-diabetes; Class 3 severe obesity due to excess calories without serious comorbidity with body mass index (BMI) of 40.0 to 44.9 in adult [facility]); Depression, recurrent (HCC); Supervision of high risk pregnancy, antepartum; and Chlamydia infection affecting pregnancy in first trimester on their problem list.  Patient reports {sx:14538}.   .  .   . Denies leaking of fluid.   The following portions of the patient's history were reviewed and updated as appropriate: allergies, current medications, past family history, past medical history, past social history, past surgical history and problem list.   Objective:  There were no vitals filed for this visit.  Fetal Status:           General:  Alert, oriented and cooperative. Patient is in no acute distress.  Skin: Skin is warm and dry. No rash noted.   Cardiovascular: Normal heart rate noted  Respiratory: Normal respiratory effort, no problems with respiration noted  Abdomen: Soft, gravid, appropriate for gestational age.        Pelvic: Cervical exam deferred        Extremities: Normal range of motion.     Mental Status: Normal mood and affect. Normal behavior. Normal judgment and thought content.   Assessment and Plan:  Pregnancy: G1P0 at 28w***d 1. Supervision of high risk pregnancy, antepartum (Primary) Offered tdap today - pt *** 28 week labs today.   2. Pre-diabetes 2 hr was normal at 22 weeks. Recheck today.  Growth on 4/30 showed ***  3. Depression, recurrent (HCC) ***  4. Chlamydia infection affecting pregnancy in first trimester TOC negative  5. Pregnancy with 28 completed weeks gestation   Preterm labor symptoms and general obstetric precautions including but not limited to vaginal bleeding, contractions,  leaking of fluid and fetal movement were reviewed in detail with the patient. Please refer to After Visit Summary for other counseling recommendations.   No follow-ups on file.  Future Appointments  Date Time Provider Department Center  02/13/2024  9:00 AM [facility] PROVIDER 1 [facility]  02/13/2024  9:30 AM [facility] US6 [facility]  02/14/2024  8:50 AM Lacey Pian, MD [facility]  03/12/2024  9:00 AM [facility] PROVIDER 1 [facility]  03/12/2024  9:30 AM [facility] US5 [facility]    Lacey Pian, MD

## 2024-02-13 ENCOUNTER — Ambulatory Visit: Attending: Obstetrics and Gynecology

## 2024-02-13 ENCOUNTER — Ambulatory Visit (HOSPITAL_BASED_OUTPATIENT_CLINIC_OR_DEPARTMENT_OTHER): Admitting: Obstetrics

## 2024-02-13 ENCOUNTER — Other Ambulatory Visit: Payer: Self-pay | Admitting: *Deleted

## 2024-02-13 DIAGNOSIS — O99213 Obesity complicating pregnancy, third trimester: Secondary | ICD-10-CM | POA: Diagnosis not present

## 2024-02-13 DIAGNOSIS — Z6841 Body Mass Index (BMI) 40.0 and over, adult: Secondary | ICD-10-CM | POA: Insufficient documentation

## 2024-02-13 DIAGNOSIS — Z3A28 28 weeks gestation of pregnancy: Secondary | ICD-10-CM | POA: Diagnosis not present

## 2024-02-13 DIAGNOSIS — E669 Obesity, unspecified: Secondary | ICD-10-CM

## 2024-02-13 DIAGNOSIS — O403XX0 Polyhydramnios, third trimester, not applicable or unspecified: Secondary | ICD-10-CM

## 2024-02-13 DIAGNOSIS — O409XX0 Polyhydramnios, unspecified trimester, not applicable or unspecified: Secondary | ICD-10-CM

## 2024-02-13 DIAGNOSIS — E66813 Obesity, class 3: Secondary | ICD-10-CM | POA: Insufficient documentation

## 2024-02-13 DIAGNOSIS — O99212 Obesity complicating pregnancy, second trimester: Secondary | ICD-10-CM | POA: Insufficient documentation

## 2024-02-13 NOTE — Progress Notes (Signed)
 MFM Consult Note  Carrie Spears is currently at 28 weeks and 0 days.  She has been followed due to maternal obesity with a BMI of 45.  She denies any problems since her last exam and has screened negative for gestational diabetes.  On today's exam, the overall EFW of 2 pounds 15 ounces measures at the 77th percentile for her gestational age.    Mild polyhydramnios with a total AFI of 24.46 cm is noted with a maximal vertical pocket of greater than 9 cm.    Due to maternal obesity, we will start weekly fetal testing at 34 weeks.    She will return in 4 weeks for another growth scan.    The patient stated that all of her questions were answered today.  A total of 20 minutes was spent counseling and coordinating the care for this patient.  Greater than 50% of the time was spent in direct face-to-face contact.

## 2024-02-14 ENCOUNTER — Encounter: Admitting: Obstetrics and Gynecology

## 2024-02-14 ENCOUNTER — Telehealth: Payer: Self-pay | Admitting: *Deleted

## 2024-02-14 DIAGNOSIS — A749 Chlamydial infection, unspecified: Secondary | ICD-10-CM

## 2024-02-14 DIAGNOSIS — Z3A28 28 weeks gestation of pregnancy: Secondary | ICD-10-CM

## 2024-02-14 DIAGNOSIS — O0990 Supervision of high risk pregnancy, unspecified, unspecified trimester: Secondary | ICD-10-CM

## 2024-02-14 DIAGNOSIS — R7303 Prediabetes: Secondary | ICD-10-CM

## 2024-02-14 DIAGNOSIS — F339 Major depressive disorder, recurrent, unspecified: Secondary | ICD-10-CM

## 2024-02-14 NOTE — Telephone Encounter (Signed)
Phone line is busy x2

## 2024-03-12 ENCOUNTER — Ambulatory Visit: Admitting: Obstetrics

## 2024-03-12 ENCOUNTER — Ambulatory Visit: Attending: Obstetrics and Gynecology

## 2024-03-12 DIAGNOSIS — O0990 Supervision of high risk pregnancy, unspecified, unspecified trimester: Secondary | ICD-10-CM | POA: Insufficient documentation

## 2024-03-12 DIAGNOSIS — E669 Obesity, unspecified: Secondary | ICD-10-CM

## 2024-03-12 DIAGNOSIS — O99213 Obesity complicating pregnancy, third trimester: Secondary | ICD-10-CM | POA: Insufficient documentation

## 2024-03-12 DIAGNOSIS — Z3A32 32 weeks gestation of pregnancy: Secondary | ICD-10-CM | POA: Insufficient documentation

## 2024-03-12 DIAGNOSIS — O403XX0 Polyhydramnios, third trimester, not applicable or unspecified: Secondary | ICD-10-CM

## 2024-03-12 DIAGNOSIS — O99212 Obesity complicating pregnancy, second trimester: Secondary | ICD-10-CM | POA: Diagnosis not present

## 2024-03-12 NOTE — Progress Notes (Signed)
 MFM Consult Note  Carrie Spears is currently at 32 weeks and 0 days.  She has been followed due to maternal obesity with a BMI of 45.  She denies any problems since her last exam and has screened negative for gestational diabetes.  On today's exam, the overall EFW of 4 pounds 9 ounces measures at the 69th percentile for her gestational age.    There was normal amniotic fluid noted today with a total AFI of 22.05 cm.    The fetus was in the breech presentation.  Due to maternal obesity, we will start weekly fetal testing at 34 weeks.    She will return in 1 week for an NST.  The patient stated that all of her questions were answered today.  A total of 10 minutes was spent counseling and coordinating the care for this patient.  Greater than 50% of the time was spent in direct face-to-face contact.

## 2024-03-19 ENCOUNTER — Ambulatory Visit: Admitting: *Deleted

## 2024-03-19 ENCOUNTER — Ambulatory Visit: Attending: Obstetrics | Admitting: *Deleted

## 2024-03-19 DIAGNOSIS — O99213 Obesity complicating pregnancy, third trimester: Secondary | ICD-10-CM

## 2024-03-19 DIAGNOSIS — O409XX0 Polyhydramnios, unspecified trimester, not applicable or unspecified: Secondary | ICD-10-CM | POA: Insufficient documentation

## 2024-03-19 DIAGNOSIS — O403XX0 Polyhydramnios, third trimester, not applicable or unspecified: Secondary | ICD-10-CM | POA: Insufficient documentation

## 2024-03-19 DIAGNOSIS — Z3A33 33 weeks gestation of pregnancy: Secondary | ICD-10-CM

## 2024-03-19 NOTE — Procedures (Signed)
 Carrie Spears 2000-11-02 [redacted]w[redacted]d  Fetus A Non-Stress Test Interpretation for 03/19/24-NST only  Indication: Polyhydramnios and Class 3 obesity  Fetal Heart Rate A Mode: External Baseline Rate (A): 140 bpm Variability: Moderate Accelerations: 15 x 15 Decelerations: None Multiple birth?: No  Uterine Activity Mode: Toco Contraction Frequency (min): none Resting Tone Palpated: Relaxed  Interpretation (Fetal Testing) Nonstress Test Interpretation: Reactive Comments: Tracing reviewed byDr. Arnie Bibber

## 2024-03-23 ENCOUNTER — Encounter: Payer: Self-pay | Admitting: Obstetrics and Gynecology

## 2024-03-27 ENCOUNTER — Ambulatory Visit: Admitting: Maternal & Fetal Medicine

## 2024-03-27 ENCOUNTER — Ambulatory Visit: Attending: Obstetrics

## 2024-03-27 DIAGNOSIS — Z3A34 34 weeks gestation of pregnancy: Secondary | ICD-10-CM

## 2024-03-27 DIAGNOSIS — O403XX0 Polyhydramnios, third trimester, not applicable or unspecified: Secondary | ICD-10-CM | POA: Diagnosis not present

## 2024-03-27 DIAGNOSIS — E669 Obesity, unspecified: Secondary | ICD-10-CM | POA: Diagnosis not present

## 2024-03-27 DIAGNOSIS — O0990 Supervision of high risk pregnancy, unspecified, unspecified trimester: Secondary | ICD-10-CM | POA: Insufficient documentation

## 2024-03-27 DIAGNOSIS — O409XX0 Polyhydramnios, unspecified trimester, not applicable or unspecified: Secondary | ICD-10-CM

## 2024-03-27 DIAGNOSIS — O99213 Obesity complicating pregnancy, third trimester: Secondary | ICD-10-CM | POA: Diagnosis not present

## 2024-03-27 NOTE — Progress Notes (Signed)
   Patient information  Patient Name: Carrie Spears  Patient MRN:   [id number]  Referring practice: MFM Referring Provider: [place] [facility] Alcide  Problem List   Patient Active Problem List   Diagnosis Date Noted   Polyhydramnios affecting pregnancy 03/19/2024   Chlamydia infection affecting pregnancy in first trimester 11/06/2023   Supervision of high risk pregnancy, antepartum 11/01/2023   Depression, recurrent (HCC) 08/18/2020   Class 3 severe obesity due to excess calories without serious comorbidity with body mass index (BMI) of 40.0 to 44.9 in adult 02/02/2016   Pre-diabetes 12/28/2014   Maternal Fetal medicine Consult  Carrie Spears is a 23 y.o. G1P0 at [redacted]w[redacted]d here for ultrasound and consultation. Carrie Spears is doing well today with no acute concerns. Today we focused on the following:   Elevated BMI: due to this we recommended continued weekly antenatal testing and serial growth US .   The patient had time to ask questions that were answered to her satisfaction.  She verbalized understanding and agrees to proceed with the plan below.  Sonographic findings Single intrauterine pregnancy. Fetal cardiac activity: Observed. Presentation: Cephalic. Interval fetal anatomy appears normal. Amniotic fluid: Within normal limits.  MVP: 4.32 cm. Placenta: Anterior. BPP: 8/8.   There are limitations of prenatal ultrasound such as the inability to detect certain abnormalities due to poor visualization. Various factors such as fetal position, gestational age and maternal body habitus may increase the difficulty in visualizing the fetal anatomy.    Recommendations 1. Continue weekly antenatal testing until delivery 2. Growth ultrasounds every 4 weeks until delivery  3. Delivery around EDD  Review of Systems: A review of systems was performed and was negative except per HPI   Vitals and Physical Exam    03/19/2024    9:27 AM 01/16/2024   10:07 AM 12/27/2023   10:00 AM   Vitals with BMI  Weight   284 lbs  Systolic 143 133 409  Diastolic 71 67 78  Pulse 101 91 86  Sitting comfortably on the sonogram table Nonlabored breathing Normal rate and rhythm Abdomen is nontender  Past pregnancies OB History  Gravida Para Term Preterm AB Living  1       SAB IAB Ectopic Multiple Live Births          # Outcome Date GA Lbr Len/2nd Weight Sex Type Anes PTL Lv  1 Current              I spent 20 minutes reviewing the patients chart, including labs and images as well as counseling the patient about her medical conditions. Greater than 50% of the time was spent in direct face-to-face patient counseling.  Penney Bowling  MFM, [facility]   03/27/2024  11:05 AM

## 2024-04-02 ENCOUNTER — Ambulatory Visit (HOSPITAL_BASED_OUTPATIENT_CLINIC_OR_DEPARTMENT_OTHER): Admitting: *Deleted

## 2024-04-02 ENCOUNTER — Ambulatory Visit: Attending: Obstetrics

## 2024-04-02 ENCOUNTER — Encounter: Payer: Self-pay | Admitting: Obstetrics and Gynecology

## 2024-04-02 VITALS — BP 133/77 | HR 109

## 2024-04-02 DIAGNOSIS — O403XX0 Polyhydramnios, third trimester, not applicable or unspecified: Secondary | ICD-10-CM | POA: Insufficient documentation

## 2024-04-02 DIAGNOSIS — O99213 Obesity complicating pregnancy, third trimester: Secondary | ICD-10-CM | POA: Diagnosis not present

## 2024-04-02 DIAGNOSIS — Z3A35 35 weeks gestation of pregnancy: Secondary | ICD-10-CM | POA: Insufficient documentation

## 2024-04-02 NOTE — Procedures (Signed)
 Carrie Spears 2000/12/19 [redacted]w[redacted]d  Fetus A Non-Stress Test Interpretation for 04/02/24- Nst only  Indication: Polyhydramnios  Fetal Heart Rate A Mode: External Baseline Rate (A): 140 bpm Variability: Moderate Accelerations: 15 x 15 Decelerations: None Multiple birth?: No  Uterine Activity Mode: Toco Contraction Frequency (min): none  Interpretation (Fetal Testing) Nonstress Test Interpretation: Reactive Comments: Tracing reviewed byDr. Nolan Battle

## 2024-04-03 ENCOUNTER — Other Ambulatory Visit

## 2024-04-03 ENCOUNTER — Ambulatory Visit

## 2024-04-03 ENCOUNTER — Telehealth: Payer: Self-pay | Admitting: *Deleted

## 2024-04-03 NOTE — Addendum Note (Signed)
 Addended by: Archer Bear on: 04/03/2024 02:43 PM   Modules accepted: Level of Service

## 2024-04-03 NOTE — Telephone Encounter (Signed)
 Left patient an urgent message to call and schedule OB appointments.

## 2024-04-10 ENCOUNTER — Ambulatory Visit: Attending: Obstetrics

## 2024-04-10 ENCOUNTER — Ambulatory Visit: Admitting: Obstetrics

## 2024-04-10 DIAGNOSIS — O0990 Supervision of high risk pregnancy, unspecified, unspecified trimester: Secondary | ICD-10-CM

## 2024-04-10 DIAGNOSIS — O3663X0 Maternal care for excessive fetal growth, third trimester, not applicable or unspecified: Secondary | ICD-10-CM | POA: Diagnosis not present

## 2024-04-10 DIAGNOSIS — E669 Obesity, unspecified: Secondary | ICD-10-CM

## 2024-04-10 DIAGNOSIS — O409XX0 Polyhydramnios, unspecified trimester, not applicable or unspecified: Secondary | ICD-10-CM | POA: Insufficient documentation

## 2024-04-10 DIAGNOSIS — O403XX0 Polyhydramnios, third trimester, not applicable or unspecified: Secondary | ICD-10-CM | POA: Diagnosis not present

## 2024-04-10 DIAGNOSIS — Z3A36 36 weeks gestation of pregnancy: Secondary | ICD-10-CM | POA: Diagnosis not present

## 2024-04-10 DIAGNOSIS — O99213 Obesity complicating pregnancy, third trimester: Secondary | ICD-10-CM

## 2024-04-10 NOTE — Progress Notes (Signed)
 MFM Consult Note  Carrie Spears is currently at 36 weeks and 1 day.  She has been followed due to maternal obesity with a BMI of 45.  She denies any problems since her last exam and has screened negative for gestational diabetes.  On today's exam, the overall EFW of 7 pounds 7 ounces measures at the 93rd percentile for her gestational age.    There was normal amniotic fluid noted today with a total AFI of 14.12 cm.    The fetus was in the vertex presentation.  A BPP performed today was 8 out of 8.    Due to maternal obesity and the large for gestational age fetus, delivery may be considered at around 39 weeks.    She will return in 1 week for an NST.  The patient stated that all of her questions were answered today.  A total of 10 minutes was spent counseling and coordinating the care for this patient.  Greater than 50% of the time was spent in direct face-to-face contact.

## 2024-04-11 ENCOUNTER — Encounter: Payer: Self-pay | Admitting: *Deleted

## 2024-04-11 ENCOUNTER — Ambulatory Visit (INDEPENDENT_AMBULATORY_CARE_PROVIDER_SITE_OTHER): Admitting: Obstetrics and Gynecology

## 2024-04-11 ENCOUNTER — Other Ambulatory Visit (HOSPITAL_COMMUNITY)
Admission: RE | Admit: 2024-04-11 | Discharge: 2024-04-11 | Disposition: A | Discharge status: Home / Self Care | Source: Ambulatory Visit | Attending: Obstetrics and Gynecology | Admitting: Obstetrics and Gynecology

## 2024-04-11 VITALS — BP 115/75 | HR 96 | Wt 297.0 lb

## 2024-04-11 DIAGNOSIS — Z3A36 36 weeks gestation of pregnancy: Secondary | ICD-10-CM

## 2024-04-11 DIAGNOSIS — O0993 Supervision of high risk pregnancy, unspecified, third trimester: Secondary | ICD-10-CM | POA: Insufficient documentation

## 2024-04-11 DIAGNOSIS — Z1331 Encounter for screening for depression: Secondary | ICD-10-CM

## 2024-04-11 DIAGNOSIS — O3663X0 Maternal care for excessive fetal growth, third trimester, not applicable or unspecified: Secondary | ICD-10-CM

## 2024-04-11 DIAGNOSIS — O98813 Other maternal infectious and parasitic diseases complicating pregnancy, third trimester: Secondary | ICD-10-CM | POA: Diagnosis not present

## 2024-04-11 DIAGNOSIS — Z3493 Encounter for supervision of normal pregnancy, unspecified, third trimester: Secondary | ICD-10-CM | POA: Diagnosis not present

## 2024-04-11 DIAGNOSIS — O98811 Other maternal infectious and parasitic diseases complicating pregnancy, first trimester: Secondary | ICD-10-CM

## 2024-04-11 DIAGNOSIS — A749 Chlamydial infection, unspecified: Secondary | ICD-10-CM | POA: Diagnosis not present

## 2024-04-11 DIAGNOSIS — O3660X0 Maternal care for excessive fetal growth, unspecified trimester, not applicable or unspecified: Secondary | ICD-10-CM | POA: Insufficient documentation

## 2024-04-11 DIAGNOSIS — O0990 Supervision of high risk pregnancy, unspecified, unspecified trimester: Secondary | ICD-10-CM

## 2024-04-11 LAB — GLUCOSE, POCT (MANUAL RESULT ENTRY): POC Glucose: 112 mg/dL — AB (ref 70–99)

## 2024-04-11 NOTE — Progress Notes (Signed)
   PRENATAL VISIT NOTE  Subjective:  Carrie Spears is a 23 y.o. G1P0 at [redacted]w[redacted]d being seen today for ongoing prenatal care.  She is currently monitored for the following issues for this low-risk pregnancy and has Pre-diabetes; Class 3 severe obesity due to excess calories without serious comorbidity with body mass index (BMI) of 40.0 to 44.9 in adult; Depression, recurrent (HCC); Supervision of high risk pregnancy, antepartum; Chlamydia infection affecting pregnancy in first trimester; and LGA (large for gestational age) fetus affecting management of mother on their problem list.  Patient reports fatigue.  Contractions: Irregular. Vag. Bleeding: None.  Movement: Present. Denies leaking of fluid.   The following portions of the patient's history were reviewed and updated as appropriate: allergies, current medications, past family history, past medical history, past social history, past surgical history and problem list.   Objective:    Vitals:   04/11/24 0806  BP: 115/75  Pulse: 96  Weight: 134.7 kg    Fetal Status:  Fetal Heart Rate (bpm): 156   Movement: Present    General: Alert, oriented and cooperative. Patient is in no acute distress.  Skin: Skin is warm and dry. No rash noted.   Cardiovascular: Normal heart rate noted  Respiratory: Normal respiratory effort, no problems with respiration noted  Abdomen: Soft, gravid, appropriate for gestational age.  Pain/Pressure: Present     Pelvic: Cervical exam deferred        Extremities: Normal range of motion.  Edema: None  Mental Status: Normal mood and affect. Normal behavior. Normal judgment and thought content.   Assessment and Plan:  Pregnancy: G1P0 at [redacted]w[redacted]d 1. [redacted] weeks gestation of pregnancy (Primary) -good FM, BP WNL. Patient last seen for appt in March but has been attending appts with MFM. She states she missed the one appt in April and kept forgetting to call back for follow up. - Cervicovaginal ancillary only - Strep Gp B  NAA - CBC - RPR - HIV Antibody (routine testing w rflx) - Glucose, 1 hour  2. Excessive fetal growth affecting management of pregnancy in third trimester, single or unspecified fetus -Growth scan with MFM yesterday Est. FW: 3387 gm 7 lb 7 oz 93 % . MFM recommends IOL at 39 weeks. Patient completed early 2 hr gtt in March and was normal but has not had testing since then. Will collect 1 hr gtt today as she is not fasting. 2 hr pp collected in office today and was 112.  - POCT Glucose (CBG)  3. Chlamydia infection affecting pregnancy in first trimester -negative TOC in March. Will recollect with 36 wk swabs today   Preterm labor symptoms and general obstetric precautions including but not limited to vaginal bleeding, contractions, leaking of fluid and fetal movement were reviewed in detail with the patient. Please refer to After Visit Summary for other counseling recommendations.   Follow up in 1 week  Future Appointments  Date Time Provider Department Center  04/17/2024 10:30 AM [facility] NURSE [facility]  04/17/2024 10:45 AM [facility] NST [facility]  04/24/2024 10:30 AM [facility] NURSE [facility]  04/24/2024 10:45 AM [facility] NST [facility]    Elenor Mole, [facility] 04/11/24

## 2024-04-12 ENCOUNTER — Encounter: Payer: Self-pay | Admitting: Obstetrics and Gynecology

## 2024-04-12 ENCOUNTER — Other Ambulatory Visit: Payer: Self-pay | Admitting: Obstetrics and Gynecology

## 2024-04-12 DIAGNOSIS — O24419 Gestational diabetes mellitus in pregnancy, unspecified control: Secondary | ICD-10-CM | POA: Insufficient documentation

## 2024-04-12 LAB — HIV ANTIBODY (ROUTINE TESTING W REFLEX): HIV Screen 4th Generation wRfx: NONREACTIVE

## 2024-04-12 LAB — RPR: RPR Ser Ql: NONREACTIVE

## 2024-04-12 LAB — CBC
Hematocrit: 30.4 % — ABNORMAL LOW (ref 34.0–46.6)
Hemoglobin: 9.8 g/dL — ABNORMAL LOW (ref 11.1–15.9)
MCH: 27.7 pg (ref 26.6–33.0)
MCHC: 32.2 g/dL (ref 31.5–35.7)
MCV: 86 fL (ref 79–97)
Platelets: 355 10*3/uL (ref 150–450)
RBC: 3.54 x10E6/uL — ABNORMAL LOW (ref 3.77–5.28)
RDW: 12.4 % (ref 11.7–15.4)
WBC: 15.8 10*3/uL — ABNORMAL HIGH (ref 3.4–10.8)

## 2024-04-12 LAB — GLUCOSE, 1 HOUR GESTATIONAL: Gestational Diabetes Screen: 137 mg/dL (ref 70–139)

## 2024-04-12 MED ORDER — ACCU-CHEK SOFTCLIX LANCETS MISC: 5 refills | Status: DC

## 2024-04-12 MED ORDER — GLUCOSE BLOOD VI STRP: ORAL_STRIP | 12 refills | Status: DC

## 2024-04-12 MED ORDER — ACCU-CHEK GUIDE W/DEVICE KIT: 1.0000 | PACK | Freq: Once | 0 refills | Status: AC

## 2024-04-12 NOTE — Progress Notes (Signed)
 Rx for Glucose supplies sent.   Carrie Spears was called and notified that her 1 hour GTT was abnormal. Although she is 36 weeks it will be important to test BS 4x per day and aggressively treat if needed.   Carrie Spears agreed to start checking her BS today and she will send her log on Monday or Tuesday of next week.  She has an OB appointment scheduled next week and knows to bring her detailed log with her to that appt.  Pharmacy called to confirm glucose monitor and supplies were received.  Dorita [name], NP 04/12/2024 11:14 AM

## 2024-04-13 ENCOUNTER — Inpatient Hospital Stay (HOSPITAL_COMMUNITY)
Admission: AD | Admit: 2024-04-13 | Discharge: 2024-04-13 | Disposition: A | Discharge status: Home / Self Care | Attending: Obstetrics & Gynecology | Admitting: Obstetrics & Gynecology

## 2024-04-13 ENCOUNTER — Encounter (HOSPITAL_COMMUNITY): Payer: Self-pay | Admitting: Obstetrics & Gynecology

## 2024-04-13 DIAGNOSIS — O479 False labor, unspecified: Secondary | ICD-10-CM

## 2024-04-13 DIAGNOSIS — O4703 False labor before 37 completed weeks of gestation, third trimester: Secondary | ICD-10-CM | POA: Diagnosis present

## 2024-04-13 DIAGNOSIS — Z3A36 36 weeks gestation of pregnancy: Secondary | ICD-10-CM | POA: Insufficient documentation

## 2024-04-13 DIAGNOSIS — Z3689 Encounter for other specified antenatal screening: Secondary | ICD-10-CM

## 2024-04-13 LAB — STREP GP B NAA: Strep Gp B NAA: NEGATIVE

## 2024-04-13 NOTE — MAU Note (Signed)
 Carrie Spears is a 23 y.o. at [redacted]w[redacted]d here in MAU reporting: started early this morning, had intercourse. After started cramping, they ended up falling asleep.  Woke up, went to the bathroom,saw some streaks of blood. Feeling pelvic pressure and the [facility] have started up.   Onset of complaint: 0700 Pain score: mild Vitals:   04/13/24 0820  BP: 133/89  Pulse: (!) 109  Resp: 17  Temp: 98.6 F (37 C)  SpO2: 98%     FHT:150 Lab orders placed from triage:

## 2024-04-13 NOTE — MAU Provider Note (Signed)
 S: Ms. Tametria Aho is a 23 y.o. G1P0 at [redacted]w[redacted]d  who presents to MAU today for labor evaluation. Having cramping/spotting after intercourse. Spotting has stopped.  Cervical exam by RN:  Dilation: Closed Effacement (%): Thick Cervical Position: Posterior Station: -3 Presentation: Undeterminable Exam by:: K. Cowher RN  Fetal Monitoring: Baseline: 135 Variability: moderate Accelerations: present Decelerations: absent Contractions: UI  MDM Discussed patient with RN. NST reviewed.   A: SIUP at [redacted]w[redacted]d  False labor  P: Discharge home Labor precautions and kick counts included in AVS Patient to follow-up with primary OB as scheduled  Patient may return to MAU as needed or when in labor   Nicholaus Almarie HERO, MD 04/13/2024 9:31 AM

## 2024-04-15 LAB — CERVICOVAGINAL ANCILLARY ONLY
Chlamydia: NEGATIVE
Comment: NEGATIVE
Comment: NORMAL
Neisseria Gonorrhea: NEGATIVE

## 2024-04-17 ENCOUNTER — Encounter: Payer: Self-pay | Admitting: Obstetrics and Gynecology

## 2024-04-17 ENCOUNTER — Ambulatory Visit

## 2024-04-17 ENCOUNTER — Other Ambulatory Visit

## 2024-04-17 ENCOUNTER — Ambulatory Visit: Admitting: Obstetrics and Gynecology

## 2024-04-17 VITALS — BP 115/76 | HR 94 | Wt 298.0 lb

## 2024-04-17 DIAGNOSIS — O24410 Gestational diabetes mellitus in pregnancy, diet controlled: Secondary | ICD-10-CM

## 2024-04-17 DIAGNOSIS — E66813 Obesity, class 3: Secondary | ICD-10-CM | POA: Diagnosis not present

## 2024-04-17 DIAGNOSIS — O3663X0 Maternal care for excessive fetal growth, third trimester, not applicable or unspecified: Secondary | ICD-10-CM | POA: Diagnosis not present

## 2024-04-17 DIAGNOSIS — F339 Major depressive disorder, recurrent, unspecified: Secondary | ICD-10-CM

## 2024-04-17 DIAGNOSIS — R7309 Other abnormal glucose: Secondary | ICD-10-CM

## 2024-04-17 DIAGNOSIS — A749 Chlamydial infection, unspecified: Secondary | ICD-10-CM

## 2024-04-17 DIAGNOSIS — Z3A37 37 weeks gestation of pregnancy: Secondary | ICD-10-CM | POA: Diagnosis not present

## 2024-04-17 DIAGNOSIS — O0990 Supervision of high risk pregnancy, unspecified, unspecified trimester: Secondary | ICD-10-CM | POA: Diagnosis not present

## 2024-04-17 DIAGNOSIS — Z6841 Body Mass Index (BMI) 40.0 and over, adult: Secondary | ICD-10-CM

## 2024-04-17 DIAGNOSIS — O98811 Other maternal infectious and parasitic diseases complicating pregnancy, first trimester: Secondary | ICD-10-CM

## 2024-04-17 NOTE — Patient Instructions (Signed)
 PLEASE bring your glucometer (blood sugar meter) to your next appointment so we can review sugars.

## 2024-04-17 NOTE — Telephone Encounter (Signed)
 Called patient to review BG. She has 5 fasting values (81, 86, 90, 116 and 86 today) but she notes that she is usually going 5-6 hours without eating before taking them. Her 2h PP are 87, 91, 93, 98, 105, 124, 132, 135, 151, and most recently 116. She reports one random value of 428 but she did not recheck that to validate the number and has otherwise never had numbers like that. We discussed plan to schedule IOL for 39 weeks and collect more data over the next week. If BG >50% elevated, could consider delivery sooner. Pt in agreement with plan.   Kieth Carolin, MD Obstetrician & Gynecologist, [facility] for [employer], [facility]

## 2024-04-21 ENCOUNTER — Encounter (HOSPITAL_COMMUNITY): Payer: Self-pay | Admitting: *Deleted

## 2024-04-21 ENCOUNTER — Telehealth (HOSPITAL_COMMUNITY): Payer: Self-pay | Admitting: *Deleted

## 2024-04-21 ENCOUNTER — Encounter: Payer: Self-pay | Admitting: Obstetrics and Gynecology

## 2024-04-21 NOTE — Progress Notes (Deleted)
   PRENATAL VISIT NOTE  Subjective:  Carrie Spears is a 23 y.o. G1P0 at 38w***d being seen today for ongoing prenatal care.  She is currently monitored for the following issues for this high-risk pregnancy and has Pre-diabetes; Class 3 severe obesity due to excess calories without serious comorbidity with body mass index (BMI) of 40.0 to 44.9 in adult; Depression, recurrent (HCC); Supervision of high risk pregnancy, antepartum; Chlamydia infection affecting pregnancy in first trimester; LGA (large for gestational age) fetus affecting management of mother; and Gestational diabetes on their problem list.  Patient reports {sx:14538}.   .  .   . Denies leaking of fluid.   The following portions of the patient's history were reviewed and updated as appropriate: allergies, current medications, past family history, past medical history, past social history, past surgical history and problem list.   Objective:    There were no vitals filed for this visit.  Fetal Status:           General: Alert, oriented and cooperative. Patient is in no acute distress.  Skin: Skin is warm and dry. No rash noted.   Cardiovascular: Normal heart rate noted  Respiratory: Normal respiratory effort, no problems with respiration noted  Abdomen: Soft, gravid, appropriate for gestational age.        Pelvic: {Blank single:19197::Cervical exam performed in the presence of a chaperone,Cervical exam deferred}        Extremities: Normal range of motion.     Mental Status: Normal mood and affect. Normal behavior. Normal judgment and thought content.   Assessment and Plan:  Pregnancy: G1P0 at 38w***d 1. Gestational diabetes mellitus (GDM), antepartum, gestational diabetes method of control unspecified (Primary) Diagnosis based on 1 hr 137 and macrosomia with AC >99%ile. Recommended IOL by message on 7/7 to be sooner than 39w. First available was 7/12 - ***.   2. Supervision of high risk pregnancy, antepartum GBS  Neg  3. Excessive fetal growth affecting management of pregnancy in third trimester, single or unspecified fetus See above  4. Class 3 severe obesity due to excess calories without serious comorbidity with body mass index (BMI) of 40.0 to 44.9 in adult   5. Depression, recurrent (HCC) Mood is ***  6. [redacted] weeks gestation of pregnancy   Term labor symptoms and general obstetric precautions including but not limited to vaginal bleeding, contractions, leaking of fluid and fetal movement were reviewed in detail with the patient. Please refer to After Visit Summary for other counseling recommendations.   No follow-ups on file.  Future Appointments  Date Time Provider Department Center  04/24/2024  8:10 AM Cleatus Moccasin, MD [facility]  04/30/2024  7:15 AM [facility] SCHED ROOM [facility] None  05/01/2024 11:10 AM Cleatus Moccasin, MD [facility]  05/08/2024 11:10 AM Erik Kieth BROCKS, MD [facility]    Moccasin Cleatus, MD

## 2024-04-21 NOTE — Telephone Encounter (Signed)
 Spot opened up for IOL on 7/8. Called pt to discuss and she is agreeable to IOL under the diagnosis of poorly controlled GDM based on growth overall and AC>HC by 3 weeks.   Reviewed process of induction.   Vina Solian, MD Attending Obstetrician & Gynecologist, [facility] for [facility], [facility]

## 2024-04-21 NOTE — Addendum Note (Signed)
 Addended by: ERIK FELTS on: 04/21/2024 01:13 PM   Modules accepted: Orders

## 2024-04-21 NOTE — Telephone Encounter (Signed)
 Preadmission screen

## 2024-04-21 NOTE — Progress Notes (Signed)
   PRENATAL VISIT NOTE  Subjective:  Carrie Spears is a 23 y.o. G1P0 at [redacted]w[redacted]d being seen today for ongoing prenatal care.  She is currently monitored for the following issues for this high-risk pregnancy and has Pre-diabetes; Class 3 severe obesity due to excess calories without serious comorbidity with body mass index (BMI) of 40.0 to 44.9 in adult; Depression, recurrent (HCC); Supervision of high risk pregnancy, antepartum; Chlamydia infection affecting pregnancy in first trimester; LGA (large for gestational age) fetus affecting management of mother; and Gestational diabetes on their problem list.  Patient reports doing OK. Checking sugars at home but did not bring log today. Interested in IOL ASAP.  Contractions: Irregular. Vag. Bleeding: None.  Movement: Present. Denies leaking of fluid.   The following portions of the patient's history were reviewed and updated as appropriate: allergies, current medications, past family history, past medical history, past social history, past surgical history and problem list.   Objective:   Vitals:   04/17/24 0800  BP: 115/76  Pulse: 94  Weight: 298 lb (135.2 kg)    Fetal Status: Fetal Heart Rate (bpm): 147   Movement: Present     General:  Alert, oriented and cooperative. Patient is in no acute distress.  Skin: Skin is warm and dry. No rash noted.   Cardiovascular: Normal heart rate noted  Respiratory: Normal respiratory effort, no problems with respiration noted  Abdomen: Soft, gravid, appropriate for gestational age.  Pain/Pressure: Present      Assessment and Plan:  Pregnancy: G1P0 at [redacted]w[redacted]d 1. Supervision of high risk pregnancy, antepartum (Primary) 2. [redacted] weeks gestation of pregnancy GBS neg See below  3. Abnormal glucose tolerance test 4. Class 3 severe obesity due to excess calories without serious comorbidity with body mass index (BMI) of 40.0 to 44.9 in adult 5. Excessive fetal growth affecting management of pregnancy in third  trimester, single or unspecified fetus - Did not have log at time of visit. Later sent log via mychart. She had 1 elevated fasting that was not truly fasting value and then 3 elevated postprandial values. Due to space constraints we will schedule for 39wk IOL and continue to monitor BG. If elevated next visit, could consider earlier IOL/direct admission.  - TWG 38lbs - Growth 6/26 @ 36/1 3387g (93%), normal AFI - NST reactive and reassuring today - Fetal nonstress test  6. Depression, recurrent (HCC) Zoloft   7. Chlamydia infection affecting pregnancy in first trimester Neg TOC at 36 weeks  Please refer to After Visit Summary for other counseling recommendations.   Return in about 1 week (around 04/24/2024) for return OB at 38 weeks.  Future Appointments  Date Time Provider Department Center  04/24/2024  8:10 AM Cleatus Moccasin, MD [facility]  04/30/2024  7:15 AM [facility] SCHED ROOM [facility] None  05/01/2024 11:10 AM Cleatus Moccasin, MD [facility]  05/08/2024 11:10 AM Erik Kieth BROCKS, MD [facility]   Kieth BROCKS Erik, MD

## 2024-04-22 ENCOUNTER — Encounter (HOSPITAL_COMMUNITY): Payer: Self-pay | Admitting: Obstetrics and Gynecology

## 2024-04-22 ENCOUNTER — Inpatient Hospital Stay (HOSPITAL_COMMUNITY): Admission: RE | Admit: 2024-04-22 | Source: Ambulatory Visit

## 2024-04-22 ENCOUNTER — Inpatient Hospital Stay (HOSPITAL_COMMUNITY)
Admission: RE | Admit: 2024-04-22 | Discharge: 2024-04-25 | DRG: 806 | Disposition: A | Discharge status: Home / Self Care | Payer: Self-pay | Attending: Family Medicine | Admitting: Family Medicine

## 2024-04-22 DIAGNOSIS — O24419 Gestational diabetes mellitus in pregnancy, unspecified control: Secondary | ICD-10-CM | POA: Diagnosis present

## 2024-04-22 DIAGNOSIS — F339 Major depressive disorder, recurrent, unspecified: Secondary | ICD-10-CM | POA: Diagnosis present

## 2024-04-22 DIAGNOSIS — Z833 Family history of diabetes mellitus: Secondary | ICD-10-CM | POA: Diagnosis not present

## 2024-04-22 DIAGNOSIS — O0990 Supervision of high risk pregnancy, unspecified, unspecified trimester: Secondary | ICD-10-CM

## 2024-04-22 DIAGNOSIS — O9081 Anemia of the puerperium: Secondary | ICD-10-CM | POA: Diagnosis not present

## 2024-04-22 DIAGNOSIS — E66813 Obesity, class 3: Secondary | ICD-10-CM | POA: Diagnosis present

## 2024-04-22 DIAGNOSIS — O99344 Other mental disorders complicating childbirth: Secondary | ICD-10-CM | POA: Diagnosis present

## 2024-04-22 DIAGNOSIS — O99214 Obesity complicating childbirth: Secondary | ICD-10-CM | POA: Diagnosis not present

## 2024-04-22 DIAGNOSIS — O3663X0 Maternal care for excessive fetal growth, third trimester, not applicable or unspecified: Secondary | ICD-10-CM | POA: Diagnosis not present

## 2024-04-22 DIAGNOSIS — R030 Elevated blood-pressure reading, without diagnosis of hypertension: Secondary | ICD-10-CM | POA: Diagnosis not present

## 2024-04-22 DIAGNOSIS — D62 Acute posthemorrhagic anemia: Secondary | ICD-10-CM | POA: Diagnosis not present

## 2024-04-22 DIAGNOSIS — R7309 Other abnormal glucose: Secondary | ICD-10-CM

## 2024-04-22 DIAGNOSIS — Z3A37 37 weeks gestation of pregnancy: Secondary | ICD-10-CM

## 2024-04-22 DIAGNOSIS — O24420 Gestational diabetes mellitus in childbirth, diet controlled: Principal | ICD-10-CM | POA: Diagnosis present

## 2024-04-22 DIAGNOSIS — O3660X0 Maternal care for excessive fetal growth, unspecified trimester, not applicable or unspecified: Secondary | ICD-10-CM | POA: Diagnosis present

## 2024-04-22 DIAGNOSIS — Z8249 Family history of ischemic heart disease and other diseases of the circulatory system: Secondary | ICD-10-CM | POA: Diagnosis not present

## 2024-04-22 DIAGNOSIS — Z3490 Encounter for supervision of normal pregnancy, unspecified, unspecified trimester: Principal | ICD-10-CM | POA: Diagnosis present

## 2024-04-22 LAB — CBC
HCT: 32.9 % — ABNORMAL LOW (ref 36.0–46.0)
Hemoglobin: 10.4 g/dL — ABNORMAL LOW (ref 12.0–15.0)
MCH: 26.8 pg (ref 26.0–34.0)
MCHC: 31.6 g/dL (ref 30.0–36.0)
MCV: 84.8 fL (ref 80.0–100.0)
Platelets: 428 K/uL — ABNORMAL HIGH (ref 150–400)
RBC: 3.88 MIL/uL (ref 3.87–5.11)
RDW: 14 % (ref 11.5–15.5)
WBC: 14.7 K/uL — ABNORMAL HIGH (ref 4.0–10.5)
nRBC: 0 % (ref 0.0–0.2)

## 2024-04-22 LAB — TYPE AND SCREEN
ABO/RH(D): A POS
Antibody Screen: NEGATIVE

## 2024-04-22 LAB — GLUCOSE, CAPILLARY: Glucose-Capillary: 95 mg/dL (ref 70–99)

## 2024-04-22 MED ORDER — SODIUM CHLORIDE 0.9 % IV SOLN: 250.0000 mL | INTRAVENOUS | Status: DC | PRN

## 2024-04-22 MED ORDER — INSULIN ASPART 100 UNIT/ML IJ SOLN: 0.0000 [IU] | INTRAMUSCULAR | Status: DC

## 2024-04-22 MED ORDER — ACETAMINOPHEN 325 MG PO TABS: 650.0000 mg | ORAL_TABLET | ORAL | Status: DC | PRN

## 2024-04-22 MED ORDER — LIDOCAINE HCL (PF) 1 % IJ SOLN: 30.0000 mL | INTRAMUSCULAR | Status: DC | PRN

## 2024-04-22 MED ORDER — MISOPROSTOL 50MCG HALF TABLET
50.0000 ug | ORAL_TABLET | Freq: Once | ORAL | Status: AC
  Administered 2024-04-22: 50 ug via ORAL
  Filled 2024-04-22: qty 1

## 2024-04-22 MED ORDER — OXYTOCIN-SODIUM CHLORIDE 30-0.9 UT/500ML-% IV SOLN: 2.5000 [IU]/h | INTRAVENOUS | Status: DC

## 2024-04-22 MED ORDER — TERBUTALINE SULFATE 1 MG/ML IJ SOLN: 0.2500 mg | Freq: Once | INTRAMUSCULAR | Status: DC | PRN

## 2024-04-22 MED ORDER — SODIUM CHLORIDE 0.9% FLUSH: 3.0000 mL | Freq: Two times a day (BID) | INTRAVENOUS | Status: DC

## 2024-04-22 MED ORDER — OXYTOCIN BOLUS FROM INFUSION
333.0000 mL | Freq: Once | INTRAVENOUS | Status: AC
  Administered 2024-04-24: 333 mL via INTRAVENOUS

## 2024-04-22 MED ORDER — SOD CITRATE-CITRIC ACID 500-334 MG/5ML PO SOLN: 30.0000 mL | ORAL | Status: DC | PRN

## 2024-04-22 MED ORDER — LACTATED RINGERS IV SOLN
500.0000 mL | INTRAVENOUS | Status: DC | PRN
  Administered 2024-04-22: 1000 mL via INTRAVENOUS

## 2024-04-22 MED ORDER — FENTANYL CITRATE (PF) 100 MCG/2ML IJ SOLN
50.0000 ug | INTRAMUSCULAR | Status: DC | PRN
  Administered 2024-04-23: 100 ug via INTRAVENOUS
  Filled 2024-04-22: qty 2

## 2024-04-22 MED ORDER — MISOPROSTOL 25 MCG QUARTER TABLET
25.0000 ug | ORAL_TABLET | Freq: Once | ORAL | Status: AC
  Administered 2024-04-22: 25 ug via VAGINAL
  Filled 2024-04-22: qty 1

## 2024-04-22 MED ORDER — SODIUM CHLORIDE 0.9% FLUSH: 3.0000 mL | INTRAVENOUS | Status: DC | PRN

## 2024-04-22 MED ORDER — ONDANSETRON HCL 4 MG/2ML IJ SOLN
4.0000 mg | Freq: Four times a day (QID) | INTRAMUSCULAR | Status: DC | PRN
  Administered 2024-04-24: 4 mg via INTRAVENOUS
  Filled 2024-04-22: qty 2

## 2024-04-22 NOTE — H&P (Signed)
 Carrie Spears is a 23 y.o. female presenting for Induction of Labor for uncontrolled Gestational Diabetes.  Has a fetus deemed LGA (93rd percentile).  Pregnancy has been followed at [facility] and remarkable for:  Patient Active Problem List   Diagnosis Date Noted   Encounter for induction of labor 04/22/2024   Gestational diabetes 04/12/2024   LGA (large for gestational age) fetus affecting management of mother 04/11/2024   Chlamydia infection affecting pregnancy in first trimester 11/06/2023   Supervision of high risk pregnancy, antepartum 11/01/2023   Depression, recurrent (HCC) 08/18/2020   Class 3 severe obesity due to excess calories without serious comorbidity with body mass index (BMI) of 40.0 to 44.9 in adult 02/02/2016   Pre-diabetes 12/28/2014   . OB History     Gravida  1   Para      Term      Preterm      AB      Living         SAB      IAB      Ectopic      Multiple      Live Births             Past Medical History:  Diagnosis Date   ADHD (attention deficit hyperactivity disorder)    Hypothyroidism 02/08/2016   On no medication. TSH 1.18 07/09/18.      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 12/2016. Does not meet biospy criteria.      Past Surgical History:  Procedure Laterality Date   NO PAST SURGERIES     Family History: family history includes Diabetes in her maternal aunt; Hypertension in her mother; Lupus in her maternal aunt. Social History:  reports that she has never smoked. She has never used smokeless tobacco. She reports that she does not drink alcohol and does not use drugs.     Maternal Diabetes: Yes:  Diabetes Type:  Diet controlled Genetic Screening: Normal Maternal Ultrasounds/Referrals: Normal Fetal Ultrasounds or other Referrals:  None Maternal Substance Abuse:  No Significant Maternal Medications:  Meds include: Other: PNV Significant Maternal Lab Results:  Group B Strep negative Number of  Prenatal Visits:greater than 3 verified prenatal visits Maternal Vaccinations:Flu Other Comments:  None  Review of Systems  Constitutional:  Negative for chills and fever.  Respiratory:  Negative for shortness of breath.   Gastrointestinal:  Negative for abdominal pain, diarrhea and nausea.  Genitourinary:  Negative for dysuria.  Neurological:  Negative for headaches.   Maternal Medical History:  Reason for admission: Nausea. IOL for GDM  Contractions: Frequency: irregular.   Perceived severity is mild.   Fetal activity: Perceived fetal activity is normal.   Last perceived fetal movement was within the past hour.   Prenatal Complications - Diabetes: gestational. Diabetes is managed by diet.     Dilation: 1 Effacement (%): Thick Station: -3 Height 5' 6 (1.676 m), weight 135.1 kg, last menstrual period 08/01/2023. Maternal Exam:  Uterine Assessment: Contraction strength is mild.  Contraction frequency is irregular.  Abdomen: Patient reports no abdominal tenderness. Introitus: Normal vulva. Normal vagina.  Ferning test: not done.  Nitrazine test: not done. Amniotic fluid character: not assessed. Cervix: Cervix evaluated by digital exam.     Fetal Exam Fetal Monitor Review: Mode: ultrasound.   Baseline rate: 135.  Variability: moderate (6-25 bpm).   Pattern: accelerations present.   Fetal State Assessment: Category I - tracings are normal.   Physical Exam Constitutional:  General: She is not in acute distress.    Appearance: Normal appearance. She is not toxic-appearing.  HENT:     Head: Normocephalic.  Cardiovascular:     Rate and Rhythm: Normal rate.  Pulmonary:     Effort: Pulmonary effort is normal.  Abdominal:     General: There is no distension.     Tenderness: There is no abdominal tenderness. There is no guarding.  Genitourinary:    General: Normal vulva.     Comments: Dilation: 1 Effacement (%): Thick Station: -3 Presentation: Vertex Exam by::  Torii Royse,CNM  Musculoskeletal:        General: Normal range of motion.     Cervical back: Normal range of motion.  Neurological:     General: No focal deficit present.     Mental Status: She is alert.  Psychiatric:        Mood and Affect: Mood normal.     Prenatal labs: ABO, Rh: --/--/PENDING (07/08 2148) Antibody: PENDING (07/08 2148) Rubella: 6.73 (01/16 1202) RPR: Non Reactive (06/27 0935)  HBsAg: Negative (01/16 1202)  HIV: Non Reactive (06/27 0935)  GBS: Negative/-- (06/27 0905)   Assessment/Plan: Single IUP at [redacted]w[redacted]d Gestational Diabetes with poor control LGA fetus Bishop score = 2  Plan: Admit to Labor and Delivery Routine orders Cytotec  per protocol Will insert Foley balloon on next exam Anticipate SVD  Carrie Spears 04/22/2024, 10:32 PM

## 2024-04-23 ENCOUNTER — Encounter (HOSPITAL_COMMUNITY): Payer: Self-pay | Admitting: Obstetrics and Gynecology

## 2024-04-23 LAB — GLUCOSE, CAPILLARY
Glucose-Capillary: 91 mg/dL (ref 70–99)
Glucose-Capillary: 88 mg/dL (ref 70–99)
Glucose-Capillary: 79 mg/dL (ref 70–99)
Glucose-Capillary: 97 mg/dL (ref 70–99)

## 2024-04-23 LAB — RPR: RPR Ser Ql: NONREACTIVE

## 2024-04-23 MED ORDER — LACTATED RINGERS IV SOLN
500.0000 mL | Freq: Once | INTRAVENOUS | Status: AC
Start: 2024-04-23 — End: 2024-04-24
  Administered 2024-04-24: 500 mL via INTRAVENOUS

## 2024-04-23 MED ORDER — MISOPROSTOL 50MCG HALF TABLET
50.0000 ug | ORAL_TABLET | Freq: Once | ORAL | Status: AC
  Administered 2024-04-23: 50 ug via ORAL
  Filled 2024-04-23: qty 1

## 2024-04-23 MED ORDER — FENTANYL-BUPIVACAINE-NACL 0.5-0.125-0.9 MG/250ML-% EP SOLN
12.0000 mL/h | EPIDURAL | Status: DC | PRN
  Administered 2024-04-24: 12 mL/h via EPIDURAL
  Filled 2024-04-23: qty 250

## 2024-04-23 MED ORDER — DIPHENHYDRAMINE HCL 50 MG/ML IJ SOLN: 12.5000 mg | INTRAMUSCULAR | Status: DC | PRN

## 2024-04-23 MED ORDER — PHENYLEPHRINE 80 MCG/ML (10ML) SYRINGE FOR IV PUSH (FOR BLOOD PRESSURE SUPPORT): 80.0000 ug | PREFILLED_SYRINGE | INTRAVENOUS | Status: DC | PRN

## 2024-04-23 MED ORDER — OXYTOCIN-SODIUM CHLORIDE 30-0.9 UT/500ML-% IV SOLN
1.0000 m[IU]/min | INTRAVENOUS | Status: DC
  Administered 2024-04-23 – 2024-04-24 (×2): 2 m[IU]/min via INTRAVENOUS
  Filled 2024-04-23: qty 500

## 2024-04-23 MED ORDER — EPHEDRINE 5 MG/ML INJ: 10.0000 mg | INTRAVENOUS | Status: DC | PRN

## 2024-04-23 MED ORDER — TERBUTALINE SULFATE 1 MG/ML IJ SOLN: 0.2500 mg | Freq: Once | INTRAMUSCULAR | Status: DC | PRN

## 2024-04-23 NOTE — Progress Notes (Signed)
 Patient ID: Carrie Spears, female   DOB: 10-Jan-2001, 23 y.o.   MRN: [id number] Attempted to insert foley balloon. Cervix is almost 2cm, stretchy, balloon kept working its way out whenever I got it inserted  Attempts stopped  WIll proceed with Pitocin  2/2 for induction

## 2024-04-23 NOTE — Progress Notes (Signed)
 Patient ID: Carrie Spears, female   DOB: 05/13/2001, 23 y.o.   MRN: [id number] Doing well  Vitals:   04/22/24 2155 04/22/24 2335 04/23/24 0114 04/23/24 0337  BP: (!) 95/53 (!) 109/55 (!) 109/51 97/75  Pulse: 96 91 86 83  Resp:  16  16  Temp:    98.5 F (36.9 C)  TempSrc:    Oral  Weight:      Height:       FHR stable 130-140s Average variability +accels Irregu UCs q2-85min

## 2024-04-23 NOTE — Progress Notes (Signed)
 Labor Progress Note  Carrie Spears is a 23 y.o. G1P0 at [redacted]w[redacted]d presented for IOL for uncontrolled GDM. LGA fetus 93%  S: Doing well no significant contractions.   O:  BP 133/72   Pulse 88   Temp 98.5 F (36.9 C) (Oral)   Resp 16   Ht 5' 6 (1.676 m)   Wt 135.1 kg   LMP 08/01/2023   BMI 48.08 kg/m  EFM:120bpm/Moderate variability/ 15x15 accels/ None decels CAT: 1 Toco: irreg   CVE: Dilation: 1.5 Effacement (%): 50 Station: -3 Presentation: Vertex Exam by:: Estevon Fluke   A&P: 23 y.o. G1P0 [redacted]w[redacted]d  here for  IOL for uncontrolled GDM. LGA fetus 93%  #Labor: dilation unchanged however cervix now more mid position. FB placed without complication 60cc in uterine balloon. Will also give an addition dose of Cytotec  50mcg orally.   #Pain: IV meds if needed at this time, may consider epidural later #FWB: CAT 1 #GBS negative   #Uncontrolled GDM: #class III obesity #LGA - total weight gain 38lbs - Growth 6/26 @ 36/1 3387g (93%), normal AFI - glucoses have all been well controlled here, so will discontinue q4 glucose checks.   Chiquita Clover, MD PGY-2 [facility] Family Medicine Resident [facility], Center for [facility] 04/23/24  7:54 PM

## 2024-04-23 NOTE — Progress Notes (Signed)
 Labor Progress Note  Carrie Spears is a 23 y.o. G1P0 at [redacted]w[redacted]d presented for IOL for uncontrolled GDM. LGA fetus 93%  S: Sill not feeling the contractions. But doing well.   O:  BP 133/66   Pulse 90   Temp 98.5 F (36.9 C) (Oral)   Resp 16   Ht 5' 6 (1.676 m)   Wt 135.1 kg   LMP 08/01/2023   BMI 48.08 kg/m  EFM:120bpm/Moderate variability/ 15x15 accels/ None decels CAT: 1 Toco: irreg   CVE: Dilation: 1.5 Effacement (%): 50 Station: -3 Presentation: Vertex Exam by:: Sharmon Cheramie   A&P: 23 y.o. G1P0 [redacted]w[redacted]d  here for  IOL for uncontrolled GDM. LGA fetus 93%  #Labor: Was given dual cytotec  then transitioned to pit around 0300. Unfortunately no change with the pitocin  will give a dose of cytotec  orally to get better ripening. Will reassess in 4 hours and try to AROM at that time and/or start pit.   #Pain: IV meds if needed at this time, may consider epidural later #FWB: CAT 1 #GBS negative   #Uncontrolled GDM: #class III obesity #LGA - glucoses well controlled - total weight gain 38lbs - Growth 6/26 @ 36/1 3387g (93%), normal AFI -glucose checks ordered q4 while in latent labor, have been well controlled  Chiquita Clover, MD PGY-2 [facility] Family Medicine Resident [facility], Center for [facility] Healthcare 04/23/24  3:09 PM

## 2024-04-23 NOTE — Progress Notes (Signed)
 Labor Progress Note  Carrie Spears is a 24 y.o. G1P0 at [redacted]w[redacted]d presented for IOL for uncontrolled GDM. LGA fetus 93%  S: Feeling well, maybe feeling the contractions a little more  O:  BP 129/76   Pulse 91   Temp 98.5 F (36.9 C) (Oral)   Resp 16   Ht 5' 6 (1.676 m)   Wt 135.1 kg   LMP 08/01/2023   BMI 48.08 kg/m  EFM:140bpm/Moderate variability/ 15x15 accels/ None decels CAT: 1 Toco: every 4-5 minutes   CVE: Dilation: 1.5 Effacement (%): 50 Station: -3 Presentation: Vertex Exam by:: Harriet Sutphen   A&P: 23 y.o. G1P0 [redacted]w[redacted]d  here for  IOL for uncontrolled GDM. LGA fetus 93%  #Labor: Was given dual cytotec  then transitioned to pit around 0300. CVE unchanged, unable to AROM at this time. Will continue titrating up the pitocin . Plan to recheck CVE in 4 hours or sooner if feeling significant contractions.  #Pain: IV meds if needed at this time, may consider epidural later #FWB: CAT 1 #GBS negative   #Uncontrolled GDM: #class III obesity #LGA - total weight gain 38lbs - Growth 6/26 @ 36/1 3387g (93%), normal AFI -glucose checks ordered q4 while in latent labor, have been well controlled  Chiquita Clover, MD PGY-2 [facility] Family Medicine Resident [facility], Center for [facility] Healthcare 04/23/24  10:24 AM

## 2024-04-24 ENCOUNTER — Inpatient Hospital Stay (HOSPITAL_COMMUNITY): Admitting: Anesthesiology

## 2024-04-24 ENCOUNTER — Encounter (HOSPITAL_COMMUNITY): Payer: Self-pay | Admitting: Obstetrics and Gynecology

## 2024-04-24 ENCOUNTER — Other Ambulatory Visit

## 2024-04-24 ENCOUNTER — Encounter: Admitting: Obstetrics and Gynecology

## 2024-04-24 ENCOUNTER — Ambulatory Visit

## 2024-04-24 DIAGNOSIS — Z3A37 37 weeks gestation of pregnancy: Secondary | ICD-10-CM | POA: Diagnosis not present

## 2024-04-24 DIAGNOSIS — F339 Major depressive disorder, recurrent, unspecified: Secondary | ICD-10-CM

## 2024-04-24 DIAGNOSIS — O0990 Supervision of high risk pregnancy, unspecified, unspecified trimester: Secondary | ICD-10-CM

## 2024-04-24 DIAGNOSIS — Z6841 Body Mass Index (BMI) 40.0 and over, adult: Secondary | ICD-10-CM

## 2024-04-24 DIAGNOSIS — O24419 Gestational diabetes mellitus in pregnancy, unspecified control: Secondary | ICD-10-CM

## 2024-04-24 DIAGNOSIS — O99344 Other mental disorders complicating childbirth: Secondary | ICD-10-CM | POA: Diagnosis not present

## 2024-04-24 DIAGNOSIS — O24420 Gestational diabetes mellitus in childbirth, diet controlled: Secondary | ICD-10-CM | POA: Diagnosis not present

## 2024-04-24 DIAGNOSIS — O3663X0 Maternal care for excessive fetal growth, third trimester, not applicable or unspecified: Secondary | ICD-10-CM | POA: Diagnosis not present

## 2024-04-24 MED ORDER — SERTRALINE HCL 50 MG PO TABS
50.0000 mg | ORAL_TABLET | Freq: Every day | ORAL | Status: DC
Start: 2024-04-24 — End: 2024-04-25
  Administered 2024-04-24 – 2024-04-25 (×2): 50 mg via ORAL
  Filled 2024-04-24 (×2): qty 1

## 2024-04-24 MED ORDER — DIBUCAINE (PERIANAL) 1 % EX OINT: 1.0000 | TOPICAL_OINTMENT | CUTANEOUS | Status: DC | PRN

## 2024-04-24 MED ORDER — ONDANSETRON HCL 4 MG PO TABS: 4.0000 mg | ORAL_TABLET | ORAL | Status: DC | PRN

## 2024-04-24 MED ORDER — ACETAMINOPHEN 325 MG PO TABS: 650.0000 mg | ORAL_TABLET | ORAL | Status: DC | PRN

## 2024-04-24 MED ORDER — ZOLPIDEM TARTRATE 5 MG PO TABS: 5.0000 mg | ORAL_TABLET | Freq: Every evening | ORAL | Status: DC | PRN

## 2024-04-24 MED ORDER — SIMETHICONE 80 MG PO CHEW: 80.0000 mg | CHEWABLE_TABLET | ORAL | Status: DC | PRN

## 2024-04-24 MED ORDER — MEASLES, MUMPS & RUBELLA VAC IJ SOLR: 0.5000 mL | Freq: Once | INTRAMUSCULAR | Status: DC

## 2024-04-24 MED ORDER — DIPHENHYDRAMINE HCL 25 MG PO CAPS: 25.0000 mg | ORAL_CAPSULE | Freq: Four times a day (QID) | ORAL | Status: DC | PRN

## 2024-04-24 MED ORDER — LIDOCAINE HCL (PF) 1 % IJ SOLN
INTRAMUSCULAR | Status: DC | PRN
Start: 2024-04-24 — End: 2024-04-24
  Administered 2024-04-24: 8 mL via EPIDURAL

## 2024-04-24 MED ORDER — ONDANSETRON HCL 4 MG/2ML IJ SOLN: 4.0000 mg | INTRAMUSCULAR | Status: DC | PRN

## 2024-04-24 MED ORDER — WITCH HAZEL-GLYCERIN EX PADS: 1.0000 | MEDICATED_PAD | CUTANEOUS | Status: DC | PRN

## 2024-04-24 MED ORDER — PRENATAL MULTIVITAMIN CH
1.0000 | ORAL_TABLET | Freq: Every day | ORAL | Status: DC
  Administered 2024-04-24 – 2024-04-25 (×2): 1 via ORAL
  Filled 2024-04-24 (×2): qty 1

## 2024-04-24 MED ORDER — SENNOSIDES-DOCUSATE SODIUM 8.6-50 MG PO TABS
2.0000 | ORAL_TABLET | ORAL | Status: DC
  Administered 2024-04-24 – 2024-04-25 (×2): 2 via ORAL
  Filled 2024-04-24 (×2): qty 2

## 2024-04-24 MED ORDER — OXYCODONE HCL 5 MG PO TABS: 5.0000 mg | ORAL_TABLET | ORAL | Status: DC | PRN

## 2024-04-24 MED ORDER — TETANUS-DIPHTH-ACELL PERTUSSIS 5-2.5-18.5 LF-MCG/0.5 IM SUSY: 0.5000 mL | PREFILLED_SYRINGE | Freq: Once | INTRAMUSCULAR | Status: DC

## 2024-04-24 MED ORDER — IBUPROFEN 600 MG PO TABS
600.0000 mg | ORAL_TABLET | Freq: Four times a day (QID) | ORAL | Status: DC
  Administered 2024-04-24 – 2024-04-25 (×5): 600 mg via ORAL
  Filled 2024-04-24 (×5): qty 1

## 2024-04-24 MED ORDER — COCONUT OIL OIL: 1.0000 | TOPICAL_OIL | Status: DC | PRN

## 2024-04-24 MED ORDER — BENZOCAINE-MENTHOL 20-0.5 % EX AERO: 1.0000 | INHALATION_SPRAY | CUTANEOUS | Status: DC | PRN

## 2024-04-24 NOTE — Progress Notes (Signed)
 Patient ID: Carrie Spears, female   DOB: 14-Jul-2001, 23 y.o.   MRN: [id number]  Feeling a lot of pressure/urge to push; Cx C/C/ vtx +1; begin pushing w urge; anticipate vag delivery  Suzen JONETTA Gentry [facility] 04/24/2024 7:07 AM

## 2024-04-24 NOTE — Anesthesia Preprocedure Evaluation (Signed)
 Anesthesia Evaluation  Patient identified by MRN, date of birth, ID band Patient awake    Reviewed: Allergy & Precautions, Patient's Chart, lab work & pertinent test results  Airway Mallampati: III  TM Distance: >3 FB Neck ROM: Full    Dental no notable dental hx.    Pulmonary neg pulmonary ROS   Pulmonary exam normal breath sounds clear to auscultation       Cardiovascular negative cardio ROS Normal cardiovascular exam Rhythm:Regular Rate:Normal     Neuro/Psych  PSYCHIATRIC DISORDERS  Depression    negative neurological ROS     GI/Hepatic negative GI ROS, Neg liver ROS,,,  Endo/Other  diabetes, Poorly Controlled, GestationalHypothyroidism  Class 3 obesity (BMI 48)  Renal/GU negative Renal ROS  negative genitourinary   Musculoskeletal negative musculoskeletal ROS (+)    Abdominal  (+) + obese  Peds negative pediatric ROS (+)  Hematology  (+) Blood dyscrasia, anemia Hb 10.4, plt 428   Anesthesia Other Findings   Reproductive/Obstetrics (+) Pregnancy                              Anesthesia Physical Anesthesia Plan  ASA: 3  Anesthesia Plan: Epidural   Post-op Pain Management:    Induction:   PONV Risk Score and Plan: 2  Airway Management Planned: Natural Airway  Additional Equipment: None  Intra-op Plan:   Post-operative Plan:   Informed Consent: I have reviewed the patients History and Physical, chart, labs and discussed the procedure including the risks, benefits and alternatives for the proposed anesthesia with the patient or authorized representative who has indicated his/her understanding and acceptance.       Plan Discussed with:   Anesthesia Plan Comments:         Anesthesia Quick Evaluation

## 2024-04-24 NOTE — Lactation Note (Signed)
 This note was copied from a baby's chart. Lactation Consultation Note  Patient Name: Carrie Spears Date: 04/24/2024 Age:24 hours Reason for consult: Initial assessment  [name] is formula feeding only. Please let LC team know if she is in need of any assistance while admitted to the hospital.  Feeding Mother's Current Feeding Choice: Formula Nipple Type: Slow - flow  Consult Status Consult Status: Complete    Recardo Hoit BS, IBCLC 04/24/2024, 3:08 PM

## 2024-04-24 NOTE — Anesthesia Procedure Notes (Addendum)
 Epidural Patient location during procedure: OB Start time: 04/24/2024 12:10 AM End time: 04/24/2024 12:28 AM  Staffing Anesthesiologist: Merla Almarie HERO, DO Performed: anesthesiologist   Preanesthetic Checklist Completed: patient identified, IV checked, risks and benefits discussed, monitors and equipment checked, pre-op evaluation and timeout performed  Epidural Patient position: sitting Prep: DuraPrep and site prepped and draped Patient monitoring: continuous pulse ox, blood pressure, heart rate and cardiac monitor Approach: midline Location: L3-L4 Injection technique: LOR air  Needle:  Needle type: Tuohy  Needle gauge: 17 G Needle length: 9 cm Needle insertion depth: 9 cm Catheter type: closed end flexible Catheter size: 19 Gauge Catheter at skin depth: 14 cm Test dose: negative  Assessment Sensory level: T8 Events: blood not aspirated, no cerebrospinal fluid, injection not painful, no injection resistance, no paresthesia and negative IV test  Additional Notes Patient identified. Risks/Benefits/Options discussed with patient including but not limited to bleeding, infection, nerve damage, paralysis, failed block, incomplete pain control, headache, blood pressure changes, nausea, vomiting, reactions to medication both or allergic, itching and postpartum back pain. Confirmed with bedside nurse the patient's most recent platelet count. Confirmed with patient that they are not currently taking any anticoagulation, have any bleeding history or any family history of bleeding disorders. Patient expressed understanding and wished to proceed. All questions were answered. Sterile technique was used throughout the entire procedure. Please see nursing notes for vital signs. Test dose was given through epidural catheter and negative prior to continuing to dose epidural or start infusion. Warning signs of high block given to the patient including shortness of breath, tingling/numbness in  hands, complete motor block, or any concerning symptoms with instructions to call for help. Patient was given instructions on fall risk and not to get out of bed. All questions and concerns addressed with instructions to call with any issues or inadequate analgesia.    DPE with 24G pencan through tuohy for confirmation of loss, clear CSF, no issues. Patient difficult to position, keep still during procedure. Reason for block:procedure for pain

## 2024-04-24 NOTE — Progress Notes (Signed)
 Patient ID: Carrie Spears, female   DOB: 08-23-2001, 23 y.o.   MRN: [id number]  Feeling low abd discomfort and nausea; Pit started at 0100  VSS, afebrile FHR 120s, +accels, occ variables w ctx Ctx difficult to trace, but appear by FHR to be q 2-3 mins with Pit @ 76mu/min Cx 9/C/0; no membrane palpated and thigh has clear fluid on it  IUP@38 .1wks A1GDM Suspected LGA Transition  -Stopped Pitocin  due to FHR variables; epidural PCA used to help with discomfort while awaiting pushing -SROM in the past couple of hours -Anticipate vag delivery  Suzen JONETTA Gentry CNM 04/24/2024 6:07 AM

## 2024-04-24 NOTE — Discharge Summary (Signed)
 Postpartum Discharge Summary  Date of Service updated 7/11     Patient Name: Carrie Spears DOB: January 05, 2001 MRN: [id number]  Date of admission: 04/22/2024 Delivery date:04/24/2024 Delivering provider: LORELI IHA D Date of discharge: 04/25/2024  Admitting diagnosis: Encounter for induction of labor [Z34.90] Intrauterine pregnancy: [redacted]w[redacted]d     Secondary diagnosis:  Principal Problem:   Encounter for induction of labor Active Problems:   Depression, recurrent (HCC)   LGA (large for gestational age) fetus affecting management of mother   Gestational diabetes  Additional problems: none    Discharge diagnosis: Term Pregnancy Delivered and GDM A1                                              Post partum procedures:none Augmentation: Pitocin , Cytotec , and IP Foley Complications: None  Hospital course: Induction of Labor With Vaginal Delivery   23 y.o. yo G1P1001 at [redacted]w[redacted]d was admitted to the hospital 04/22/2024 for induction of labor.  Indication for induction: A1 DM.  Patient had an uncomplicated labor course.  #borderline elevated BP - started on a 5 day course of lasix   #Depression - on sertraline  aduring preg - EDS neg   Membrane Rupture Time/Date: 5:55 AM,04/24/2024  Delivery Method:Vaginal, Spontaneous Operative Delivery:N/A Episiotomy: None Lacerations:  None Details of delivery can be found in separate delivery note.  Patient had a postpartum course complicated. Patient is discharged home 04/25/24.  Newborn Data: Birth date:04/24/2024 Birth time:7:55 AM Gender:Female Living status:Living Apgars:8 ,9  Weight:3350 g  Magnesium Sulfate received: No BMZ received: No Rhophylac:N/A MMR:N/A T-DaP:declined prenatally Flu: Yes RSV Vaccine received: No Transfusion:No  Immunizations received: Immunization History  Administered Date(s) Administered   DTaP 11/06/2000, 04/02/2001, 12/05/2001, 08/12/2002, 11/16/2004   HIB (PRP-OMP) Aug 25, 2000, 08/20/2000, 04/02/2001    HIB, Unspecified 2000/09/13, 08/20/2000, 04/02/2001   HPV 9-valent 05/12/2016   HPV Quadrivalent 11/07/2013   Hepatitis B 08/20/2000, 11/06/2000, 04/02/2001   IPV 08/20/2000, 11/06/2000, 04/02/2001, 11/16/2004   Influenza, High Dose Seasonal PF 09/15/2011   Influenza, Seasonal, Injecte, Preservative Fre 06/19/2023, 11/01/2023   Influenza,inj,Quad PF,6+ Mos 07/08/2018, 07/31/2019, 09/28/2020, 08/24/2021, 07/20/2022   Influenza-Unspecified 07/24/2014   MMR 12/05/2001, 11/16/2004   Meningococcal Conjugate 05/12/2016   Moderna Sars-Covid-2 Vaccination 10/17/2020, 11/18/2020   PFIZER Comirnaty(Gray Top)Covid-19 Tri-Sucrose Vaccine 07/20/2022   Tdap 06/06/2012, 06/19/2023   Varicella 08/12/2002, 05/12/2016    Physical exam  Vitals:   04/24/24 1823 04/25/24 0120 04/25/24 0624 04/25/24 1412  BP: 123/60 123/62 126/75 128/67  Pulse: 82 85 78 72  Resp: 18 18 18 18   Temp: 98.2 F (36.8 C) 98 F (36.7 C) 97.9 F (36.6 C) 98.6 F (37 C)  TempSrc: Oral Oral Oral Oral  SpO2: 97% 99% 98% 99%  Weight:      Height:       General: alert Lochia: appropriate Uterine Fundus: firm  DVT Evaluation: No evidence of DVT seen on physical exam. Labs: Lab Results  Component Value Date   WBC 14.7 (H) 04/22/2024   HGB 10.4 (L) 04/22/2024   HCT 32.9 (L) 04/22/2024   MCV 84.8 04/22/2024   PLT 428 (H) 04/22/2024      Latest Ref Rng & Units 06/19/2023   11:57 AM  CMP  Glucose 70 - 99 mg/dL 81   BUN 6 - 20 mg/dL 8   Creatinine 9.42 - 8.99 mg/dL 9.38   Sodium 865 -  144 mmol/L 140   Potassium 3.5 - 5.2 mmol/L 4.1   Chloride 96 - 106 mmol/L 104   CO2 20 - 29 mmol/L 22   Calcium 8.7 - 10.2 mg/dL 9.1   Total Protein 6.0 - 8.5 g/dL 6.9   Total Bilirubin 0.0 - 1.2 mg/dL 0.2   Alkaline Phos 44 - 121 IU/L 98   AST 0 - 40 IU/L 16   ALT 0 - 32 IU/L 18    Edinburgh Score:    04/25/2024   10:00 AM  Edinburgh Postnatal Depression Scale Screening Tool  I have been able to laugh and see the funny side  of things. 0  I have looked forward with enjoyment to things. 0  I have blamed myself unnecessarily when things went wrong. 1  I have been anxious or worried for no good reason. 2  I have felt scared or panicky for no good reason. 0  Things have been getting on top of me. 0  I have been so unhappy that I have had difficulty sleeping. 0  I have felt sad or miserable. 0  I have been so unhappy that I have been crying. 0  The thought of harming myself has occurred to me. 0  Edinburgh Postnatal Depression Scale Total 3   Edinburgh Postnatal Depression Scale Total: 3   After visit meds:  Allergies as of 04/25/2024   No Known Allergies      Medication List     TAKE these medications    acetaminophen  325 MG tablet Commonly known as: Tylenol  Take 2 tablets (650 mg total) by mouth every 4 (four) hours as needed (for pain scale < 4).   ferrous sulfate  325 (65 FE) MG tablet Take 1 tablet (325 mg total) by mouth every other day. Start taking on: April 27, 2024   furosemide  20 MG tablet Commonly known as: LASIX  Take 1 tablet (20 mg total) by mouth daily. Start taking on: April 26, 2024   ibuprofen  600 MG tablet Commonly known as: ADVIL  Take 1 tablet (600 mg total) by mouth every 6 (six) hours.   senna-docusate 8.6-50 MG tablet Commonly known as: Senokot-S Take 2 tablets by mouth daily. Start taking on: April 26, 2024   sertraline  50 MG tablet Commonly known as: Zoloft  Take 0.5 tablets (25 mg total) by mouth daily for 7 days, THEN 1 tablet (50 mg total) daily. Start taking on: November 29, 2023         Discharge home in stable condition Infant Feeding: Breast Infant Disposition:home with mother Discharge instruction: per After Visit Summary and Postpartum booklet. Activity: Advance as tolerated. Pelvic rest for 6 weeks.  Diet: routine diet Future Appointments: Future Appointments  Date Time Provider Department Center  05/22/2024  8:10 AM Dunn, Rollo DASEN, MD [facility]   Follow up Visit:  Loreli Suzen BIRCH, CNM  P [facility] Support Pool Please schedule this patient for Postpartum visit in: 6 weeks with the following provider: Any provider In-Person For C/S patients schedule nurse incision check in weeks 2 weeks: no High risk pregnancy complicated by: A1GDM Delivery mode:  SVD Anticipated Birth Control:  unsure but thinks nuvaring postpartum  PP Procedures needed: 2 hour GTT Schedule Integrated BH visit: no   04/25/2024 Chiquita Clover, MD

## 2024-04-25 MED ORDER — FERROUS SULFATE 325 (65 FE) MG PO TABS
325.0000 mg | ORAL_TABLET | ORAL | Status: DC
  Administered 2024-04-25: 325 mg via ORAL
  Filled 2024-04-25: qty 1

## 2024-04-25 MED ORDER — POTASSIUM CHLORIDE CRYS ER 20 MEQ PO TBCR
20.0000 meq | EXTENDED_RELEASE_TABLET | Freq: Every day | ORAL | Status: DC
  Administered 2024-04-25: 20 meq via ORAL
  Filled 2024-04-25: qty 1

## 2024-04-25 MED ORDER — FERROUS SULFATE 325 (65 FE) MG PO TABS: 325.0000 mg | ORAL_TABLET | ORAL | 3 refills | Status: DC

## 2024-04-25 MED ORDER — ACETAMINOPHEN 325 MG PO TABS: 650.0000 mg | ORAL_TABLET | ORAL | 0 refills | Status: DC | PRN

## 2024-04-25 MED ORDER — SENNOSIDES-DOCUSATE SODIUM 8.6-50 MG PO TABS: 2.0000 | ORAL_TABLET | ORAL | 0 refills | Status: DC

## 2024-04-25 MED ORDER — POTASSIUM CHLORIDE CRYS ER 20 MEQ PO TBCR: 20.0000 meq | EXTENDED_RELEASE_TABLET | Freq: Every day | ORAL | 0 refills | Status: DC

## 2024-04-25 MED ORDER — FUROSEMIDE 20 MG PO TABS
20.0000 mg | ORAL_TABLET | Freq: Every day | ORAL | Status: DC
  Administered 2024-04-25: 20 mg via ORAL
  Filled 2024-04-25: qty 1

## 2024-04-25 MED ORDER — FUROSEMIDE 20 MG PO TABS: 20.0000 mg | ORAL_TABLET | Freq: Every day | ORAL | 0 refills | Status: DC

## 2024-04-25 MED ORDER — IBUPROFEN 600 MG PO TABS: 600.0000 mg | ORAL_TABLET | Freq: Four times a day (QID) | ORAL | 0 refills | Status: DC

## 2024-04-25 NOTE — Progress Notes (Signed)
 [name] was referred for history of anxiety.  * Referral screened out by Clinical Social Worker because none of the following criteria appear to apply:  ~ History of anxiety during this pregnancy, or of post-partum depression following prior delivery.  ~ Diagnosis of anxiety within last 3 years  OR  * [name]'s symptoms currently being treated with medication and/or therapy. Per OB notes, [name] has an active prescription for Zoloft  50mg    Please contact the Clinical Social Worker if needs arise, by [facility] request, or if [name] scores greater than 9/yes to question 10 on Edinburgh Postpartum Depression Screen.  Carrie Spears, [place] Clinical Social Worker [phone]

## 2024-04-25 NOTE — Progress Notes (Signed)
 POSTPARTUM PROGRESS NOTE  Post Partum Day 1  Subjective:  Carrie Spears is a 23 y.o. G1P1001 s/p SVD at [redacted]w[redacted]d.  She reports she is doing well. No acute events overnight. She denies any problems with ambulating, voiding or po intake. Denies nausea or vomiting.  Pain is well controlled.  Lochia is appropriate.  Objective: Blood pressure 126/75, pulse 78, temperature 97.9 F (36.6 C), temperature source Oral, resp. rate 18, height 5' 6 (1.676 m), weight 135.1 kg, last menstrual period 08/01/2023, SpO2 98%, unknown if currently breastfeeding.  Physical Exam:  General: alert, cooperative and no distress Chest: no respiratory distress Heart:regular rate, distal pulses intact Uterine Fundus: firm, appropriately tender DVT Evaluation: No calf swelling or tenderness Extremities: trace edema Skin: warm, dry  Recent Labs    04/22/24 2149  HGB 10.4*  HCT 32.9*    Assessment/Plan: Carrie Spears is a 23 y.o. G1P1001 s/p SVD at [redacted]w[redacted]d   PPD#1 - Doing well  Routine postpartum care  ABLA; expected, asymptomatic, clinically significant  Start oral iron repletion  Contraception: NuvaRing Feeding: Breast Dispo: Plan for discharge later today, tomorrow pending babe.   LOS: 3 days   Mardy Shropshire, MD OB Fellow  04/25/2024, 6:34 AM

## 2024-04-25 NOTE — Progress Notes (Signed)
 Due to Social concerns and a meeting between  outside and hospital social workers, more than 3 visitors including maternal patients children will be in patients room to determine status of an open CPS case

## 2024-04-25 NOTE — Anesthesia Postprocedure Evaluation (Signed)
 Anesthesia Post Note  Patient: Carrie Spears  Procedure(s) Performed: AN AD HOC LABOR EPIDURAL     Patient location during evaluation: Mother Baby Anesthesia Type: Epidural Level of consciousness: awake Pain management: satisfactory to patient Vital Signs Assessment: post-procedure vital signs reviewed and stable Respiratory status: spontaneous breathing Cardiovascular status: stable Anesthetic complications: no   No notable events documented.  Last Vitals:  Vitals:   04/25/24 0120 04/25/24 0624  BP: 123/62 126/75  Pulse: 85 78  Resp: 18 18  Temp: 36.7 C 36.6 C  SpO2: 99% 98%    Last Pain:  Vitals:   04/25/24 0624  TempSrc: Oral  PainSc:    Pain Goal: Patients Stated Pain Goal: 0 (04/24/24 0600)                 JEANENNE HANDING

## 2024-04-30 ENCOUNTER — Inpatient Hospital Stay (HOSPITAL_COMMUNITY)

## 2024-05-01 ENCOUNTER — Encounter: Admitting: Obstetrics and Gynecology

## 2024-05-06 ENCOUNTER — Telehealth (HOSPITAL_COMMUNITY): Payer: Self-pay

## 2024-05-06 NOTE — Telephone Encounter (Signed)
 05/06/2024 1425  Name: Lakhia Gengler MRN: [id number] DOB: 06-07-2001  Reason for Call:  Transition of Care Hospital Discharge Call  Contact Status: Patient Contact Status: Complete  Language assistant needed:          Follow-Up Questions: Do You Have Any Concerns About Your Health As You Heal From Delivery?: No Do You Have Any Concerns About Your Infants Health?: No  Edinburgh Postnatal Depression Scale:  In the Past 7 Days: I have been able to laugh and see the funny side of things.: As much as I always could I have looked forward with enjoyment to things.: As much as I ever did I have blamed myself unnecessarily when things went wrong.: No, never I have been anxious or worried for no good reason.: Yes, sometimes I have felt scared or panicky for no good reason.: No, not at all Things have been getting on top of me.: No, I have been coping as well as ever I have been so unhappy that I have had difficulty sleeping.: Not at all I have felt sad or miserable.: No, not at all I have been so unhappy that I have been crying.: No, never The thought of harming myself has occurred to me.: Never Edinburgh Postnatal Depression Scale Total: 2  PHQ2-9 Depression Scale:     Discharge Follow-up: Edinburgh score requires follow up?: No Patient was advised of the following resources:: Breastfeeding Support Group, Support Group  Post-discharge interventions: Reviewed Newborn Safe Sleep Practices  Signature  Rosaline Deretha PEAK

## 2024-05-08 ENCOUNTER — Encounter: Admitting: Obstetrics and Gynecology

## 2024-05-22 ENCOUNTER — Ambulatory Visit: Admitting: Obstetrics and Gynecology

## 2024-06-05 ENCOUNTER — Encounter: Payer: Self-pay | Admitting: Obstetrics and Gynecology

## 2024-06-05 ENCOUNTER — Ambulatory Visit: Admitting: Obstetrics and Gynecology

## 2024-06-05 ENCOUNTER — Telehealth: Payer: Self-pay | Admitting: *Deleted

## 2024-06-05 NOTE — Telephone Encounter (Signed)
 Left patient a message to call and reschedule

## 2024-06-10 ENCOUNTER — Telehealth: Payer: Self-pay

## 2024-06-10 ENCOUNTER — Telehealth: Admitting: Obstetrics and Gynecology

## 2024-06-10 NOTE — Telephone Encounter (Signed)
 RN attempted to call patient to request to connect to virtual visit with provider. RN left HIPAA compliant voicemail to call office.  Silvano LELON Piano, RN

## 2024-06-19 ENCOUNTER — Ambulatory Visit: Admitting: Obstetrics and Gynecology

## 2024-06-25 ENCOUNTER — Ambulatory Visit: Admitting: Obstetrics and Gynecology

## 2024-06-25 DIAGNOSIS — O24419 Gestational diabetes mellitus in pregnancy, unspecified control: Secondary | ICD-10-CM

## 2024-07-18 ENCOUNTER — Ambulatory Visit: Admitting: Certified Nurse Midwife

## 2024-07-22 ENCOUNTER — Ambulatory Visit: Admitting: Obstetrics and Gynecology

## 2024-07-22 ENCOUNTER — Other Ambulatory Visit: Payer: Self-pay | Admitting: Obstetrics and Gynecology

## 2024-07-22 ENCOUNTER — Encounter: Payer: Self-pay | Admitting: Obstetrics and Gynecology

## 2024-07-22 ENCOUNTER — Other Ambulatory Visit (HOSPITAL_COMMUNITY)
Admission: RE | Admit: 2024-07-22 | Discharge: 2024-07-22 | Disposition: A | Discharge status: Home / Self Care | Source: Ambulatory Visit | Attending: Obstetrics and Gynecology | Admitting: Obstetrics and Gynecology

## 2024-07-22 VITALS — BP 114/72 | HR 88 | Ht 66.0 in | Wt 279.0 lb

## 2024-07-22 DIAGNOSIS — E039 Hypothyroidism, unspecified: Secondary | ICD-10-CM | POA: Diagnosis not present

## 2024-07-22 DIAGNOSIS — N926 Irregular menstruation, unspecified: Secondary | ICD-10-CM

## 2024-07-22 DIAGNOSIS — Z113 Encounter for screening for infections with a predominantly sexual mode of transmission: Secondary | ICD-10-CM

## 2024-07-22 LAB — POCT URINE PREGNANCY: Preg Test, Ur: NEGATIVE

## 2024-07-22 MED ORDER — ETONOGESTREL-ETHINYL ESTRADIOL 0.12-0.015 MG/24HR VA RING
1.0000 | VAGINAL_RING | VAGINAL | 11 refills | Status: AC
Start: 2024-07-22 — End: ?

## 2024-07-22 MED ORDER — SERTRALINE HCL 50 MG PO TABS: 50.0000 mg | ORAL_TABLET | Freq: Every day | ORAL | 2 refills | Status: AC

## 2024-07-22 NOTE — Progress Notes (Signed)
 GYNECOLOGY ANNUAL PREVENTATIVE CARE ENCOUNTER NOTE  History:     Carrie Spears is a 23 y.o. G42P1001 female here for a routine annual gynecologic exam.  Current complaints: some anxiety and depression. 12 weeks PP. Feels overwhelmed at times. Instereste in starting meds.   Denies abnormal vaginal bleeding, discharge, pelvic pain, problems with intercourse or other gynecologic concerns.   Enlarged thyroid . Tsh normal recently. Has PCP, requesting thyroid  US  today   Gynecologic History No LMP recorded (lmp unknown). Contraception: none Last Pap: 10/2023. Result was normal with negative HPV Last Mammogram: NA Obstetric History OB History  Gravida Para Term Preterm AB Living  1 1 1   1   SAB IAB Ectopic Multiple Live Births     0 1    # Outcome Date GA Lbr Len/2nd Weight Sex Type Anes PTL Lv  1 Term 04/24/24 [redacted]w[redacted]d 10:59 / 00:55 7 lb 6.2 oz (3.35 kg) M Vag-Spont EPI  LIV    Past Medical History:  Diagnosis Date   ADHD (attention deficit hyperactivity disorder)    Hypothyroidism 02/08/2016   On no medication. TSH 1.18 07/09/18.      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 12/2016. Does not meet biospy criteria.       Past Surgical History:  Procedure Laterality Date   NO PAST SURGERIES      Current Outpatient Medications on File Prior to Visit  Medication Sig Dispense Refill   acetaminophen  (TYLENOL ) 325 MG tablet Take 2 tablets (650 mg total) by mouth every 4 (four) hours as needed (for pain scale < 4). (Patient not taking: Reported on 07/22/2024) 30 tablet 0   furosemide  (LASIX ) 20 MG tablet Take 1 tablet (20 mg total) by mouth daily. (Patient not taking: Reported on 07/22/2024) 4 tablet 0   ibuprofen  (ADVIL ) 600 MG tablet Take 1 tablet (600 mg total) by mouth every 6 (six) hours. (Patient not taking: Reported on 07/22/2024) 30 tablet 0   potassium chloride  SA (KLOR-CON  M) 20 MEQ tablet Take 1 tablet (20 mEq total) by mouth daily. (Patient not taking:  Reported on 07/22/2024) 4 tablet 0   senna-docusate (SENOKOT-S) 8.6-50 MG tablet Take 2 tablets by mouth daily. (Patient not taking: Reported on 07/22/2024) 30 tablet 0   sertraline  (ZOLOFT ) 50 MG tablet Take 0.5 tablets (25 mg total) by mouth daily for 7 days, THEN 1 tablet (50 mg total) daily. (Patient not taking: No sig reported) 30 tablet 11   No current facility-administered medications on file prior to visit.    No Known Allergies  Social History:  reports that she has never smoked. She has never used smokeless tobacco. She reports that she does not drink alcohol and does not use drugs.  Family History  Problem Relation Age of Onset   Hypertension Mother    Lupus Maternal Aunt    Diabetes Maternal Aunt     The following portions of the patient's history were reviewed and updated as appropriate: allergies, current medications, past family history, past medical history, past social history, past surgical history and problem list.  Review of Systems Pertinent items noted in HPI and remainder of comprehensive ROS otherwise negative.  Physical Exam:  BP 114/72   Pulse 88   Ht 5' 6 (1.676 m)   Wt 279 lb (126.6 kg)   LMP  (LMP Unknown)   Breastfeeding No   BMI 45.03 kg/m  CONSTITUTIONAL: Well-developed, well-nourished female in no acute distress.  HENT:  Normocephalic, atraumatic, External  right and left ear normal.  EYES: Conjunctivae and EOM are normal. Pupils are equal, round, and reactive to light. No scleral icterus.  NECK: Normal range of motion, supple, no masses. Enlarged thyroid .  SKIN: Skin is warm and dry. No rash noted. Not diaphoretic. No erythema. No pallor. MUSCULOSKELETAL: Normal range of motion. No tenderness.  No cyanosis, clubbing, or edema. NEUROLOGIC: Alert and oriented to person, place, and time. Normal reflexes, muscle tone coordination.  PSYCHIATRIC: Normal mood and affect. Normal behavior. Normal judgment and thought content. CARDIOVASCULAR: Normal heart  rate noted, regular rhythm RESPIRATORY: Clear to auscultation bilaterally. Effort and breath sounds normal, no problems with respiration noted. BREASTS: Symmetric in size. No masses, tenderness, skin changes, nipple drainage, or lymphadenopathy bilaterally. Performed in the presence of a chaperone. ABDOMEN: Soft, no distention noted.  No tenderness, rebound or guarding.  PELVIC: Deferred. STI testing self collected.    Assessment and Plan:   1. Hypothyroidism, unspecified type  - US  THYROID ; Future - TSH in January normal - If abnormal thyroid  US  will recheck full thyroid  panel.   2. Screening examination for STD (sexually transmitted disease) (Primary)  - Cervicovaginal ancillary only( [place])  3. Missed period  - POCT urine pregnancy  -Considering BC, that is why Urine preg done.    Normal breast examination today, she was advised to perform periodic self breast examinations.  Routine preventative health maintenance measures emphasized. Please refer to After Visit Summary for other counseling recommendations.     Carrie Spears, [name], NP [facility] [employer], [facility]

## 2024-07-23 ENCOUNTER — Telehealth: Payer: Self-pay | Admitting: *Deleted

## 2024-07-23 LAB — CERVICOVAGINAL ANCILLARY ONLY
Chlamydia: NEGATIVE
Comment: NEGATIVE
Comment: NEGATIVE
Comment: NORMAL
Neisseria Gonorrhea: NEGATIVE
Trichomonas: POSITIVE — AB

## 2024-07-23 NOTE — Telephone Encounter (Signed)
 Returned call from 07/22/2024 at 3:43 PM. Left patient a message to call the office back.

## 2024-07-24 ENCOUNTER — Other Ambulatory Visit

## 2024-07-25 ENCOUNTER — Ambulatory Visit: Payer: Self-pay | Admitting: Obstetrics and Gynecology

## 2024-07-25 MED ORDER — METRONIDAZOLE 500 MG PO TABS: 500.0000 mg | ORAL_TABLET | Freq: Two times a day (BID) | ORAL | 0 refills | Status: AC

## 2024-07-25 MED ORDER — METRONIDAZOLE 500 MG PO TABS: 2000.0000 mg | ORAL_TABLET | Freq: Once | ORAL | 0 refills | Status: DC

## 2024-09-01 ENCOUNTER — Encounter: Payer: Self-pay | Admitting: Physician Assistant

## 2024-09-01 MED ORDER — NAPROXEN 500 MG PO TABS: 500.0000 mg | ORAL_TABLET | Freq: Two times a day (BID) | ORAL | 2 refills | Status: AC

## 2024-09-04 ENCOUNTER — Encounter: Payer: Self-pay | Admitting: Physician Assistant

## 2024-09-04 DIAGNOSIS — R4586 Emotional lability: Secondary | ICD-10-CM

## 2024-09-10 NOTE — Telephone Encounter (Signed)
 LM requesting a callback to schedule with Carrie Spears and for her flu shot

## 2024-09-23 NOTE — Telephone Encounter (Signed)
Orders Placed This Encounter  Procedures   Ambulatory referral to Behavioral Health    Referral Priority:   Routine    Referral Type:   Psychiatric    Referral Reason:   Specialty Services Required    Requested Specialty:   Behavioral Health    Number of Visits Requested:   1
